# Patient Record
Sex: Female | Born: 1980 | Race: Black or African American | Hispanic: No | Marital: Single | Smoking: Former smoker
Health system: Southern US, Community
[De-identification: ages and names within clinical notes are randomized; demographics above are authoritative.]

## PROBLEM LIST (undated history)

## (undated) DIAGNOSIS — D250 Submucous leiomyoma of uterus: Secondary | ICD-10-CM

## (undated) HISTORY — PX: TONSILLECTOMY: SUR1361

---

## 2015-05-02 ENCOUNTER — Other Ambulatory Visit: Payer: Self-pay | Admitting: Obstetrics and Gynecology

## 2015-05-04 ENCOUNTER — Encounter (HOSPITAL_COMMUNITY): Payer: Self-pay | Admitting: *Deleted

## 2015-05-09 NOTE — H&P (Signed)
  Admission History and Physical Exam for a Gynecology Patient  [name] is a 35 y.o. female, No obstetric history on file., who presents for hysteroscopy, D&C, and possible resection of endometrial fibroid. She has been followed At the [facility] and Gynecology division of [facility]. She complains of menorrhagia. An ultrasound showed a multi-fibroid uterus.  OB History    No data available      History reviewed. No pertinent past medical history.  No prescriptions prior to admission    Past Surgical History  Procedure Laterality Date  . Cesarean section      x1    No Known Allergies  Family History: family history is not on file.  Social History:  reports that she has been smoking Cigarettes.  She has been smoking about 0.50 packs per day. She has never used smokeless tobacco. She reports that she does not drink alcohol or use illicit drugs.  Review of systems: See HPI.  Admission Physical Exam:    BMI equals 21.6  HEENT:                 Within normal limits Chest:                   Clear Heart:                    Regular rate and rhythm Breasts:                No masses, skin changes, bleeding, or discharge present Abdomen:             Nontender, no masses Extremities:          Grossly normal Neurologic exam: Grossly normal  Pelvic exam:  External genitalia: normal general appearance Vaginal: normal without tenderness, induration or masses Cervix: normal appearance Adnexa: normal bimanual exam Uterus: 10-12 week size, irregular, firm    Assessment:  Menorrhagia Fibroid uterus Cigarette smoker  Plan:  The patient will undergo a hysteroscopy, possible resection of submucosal fibroids, and a D&C. The patient understands the indications for her surgical procedure. She accepts the risk of, but not limited to, anesthetic complications, bleeding, infections, and possible damage to surrounding  organs.   Eli Hose 05/09/2015

## 2015-05-10 ENCOUNTER — Encounter (HOSPITAL_COMMUNITY)
Admission: RE | Disposition: A | Discharge status: Home / Self Care | Payer: Self-pay | Source: Ambulatory Visit | Attending: Obstetrics and Gynecology

## 2015-05-10 ENCOUNTER — Encounter (HOSPITAL_COMMUNITY): Payer: Self-pay | Admitting: *Deleted

## 2015-05-10 ENCOUNTER — Ambulatory Visit (HOSPITAL_COMMUNITY)
Admission: RE | Admit: 2015-05-10 | Discharge: 2015-05-10 | Disposition: A | Discharge status: Home / Self Care | Payer: Managed Care, Other (non HMO) | Source: Ambulatory Visit | Attending: Obstetrics and Gynecology | Admitting: Obstetrics and Gynecology

## 2015-05-10 ENCOUNTER — Ambulatory Visit (HOSPITAL_COMMUNITY): Payer: Managed Care, Other (non HMO) | Admitting: Anesthesiology

## 2015-05-10 DIAGNOSIS — N920 Excessive and frequent menstruation with regular cycle: Secondary | ICD-10-CM | POA: Diagnosis present

## 2015-05-10 DIAGNOSIS — D250 Submucous leiomyoma of uterus: Secondary | ICD-10-CM | POA: Diagnosis not present

## 2015-05-10 DIAGNOSIS — D649 Anemia, unspecified: Secondary | ICD-10-CM | POA: Insufficient documentation

## 2015-05-10 DIAGNOSIS — F17210 Nicotine dependence, cigarettes, uncomplicated: Secondary | ICD-10-CM | POA: Diagnosis not present

## 2015-05-10 HISTORY — PX: DILATATION & CURRETTAGE/HYSTEROSCOPY WITH RESECTOCOPE: SHX5572

## 2015-05-10 LAB — CBC
HCT: 32.5 % — ABNORMAL LOW (ref 36.0–46.0)
HEMOGLOBIN: 9.6 g/dL — AB (ref 12.0–15.0)
MCH: 19.9 pg — ABNORMAL LOW (ref 26.0–34.0)
MCHC: 29.5 g/dL — ABNORMAL LOW (ref 30.0–36.0)
MCV: 67.4 fL — ABNORMAL LOW (ref 78.0–100.0)
Platelets: 423 10*3/uL — ABNORMAL HIGH (ref 150–400)
RBC: 4.82 MIL/uL (ref 3.87–5.11)
RDW: 16.9 % — ABNORMAL HIGH (ref 11.5–15.5)
WBC: 6.2 10*3/uL (ref 4.0–10.5)

## 2015-05-10 LAB — PREGNANCY, URINE: PREG TEST UR: NEGATIVE

## 2015-05-10 SURGERY — DILATATION & CURETTAGE/HYSTEROSCOPY WITH RESECTOCOPE
Anesthesia: General | Site: Vagina

## 2015-05-10 MED ORDER — FENTANYL CITRATE (PF) 100 MCG/2ML IJ SOLN
INTRAMUSCULAR | Status: AC
  Filled 2015-05-10: qty 2

## 2015-05-10 MED ORDER — KETOROLAC TROMETHAMINE 30 MG/ML IJ SOLN
INTRAMUSCULAR | Status: DC | PRN
  Administered 2015-05-10: 30 mg via INTRAMUSCULAR
  Administered 2015-05-10: 30 mg via INTRAVENOUS

## 2015-05-10 MED ORDER — IBUPROFEN 800 MG PO TABS: 800.0000 mg | ORAL_TABLET | Freq: Three times a day (TID) | ORAL | Status: DC | PRN

## 2015-05-10 MED ORDER — MEPERIDINE HCL 25 MG/ML IJ SOLN: 6.2500 mg | INTRAMUSCULAR | Status: DC | PRN

## 2015-05-10 MED ORDER — ONDANSETRON HCL 4 MG/2ML IJ SOLN: 4.0000 mg | Freq: Once | INTRAMUSCULAR | Status: DC | PRN

## 2015-05-10 MED ORDER — DEXAMETHASONE SODIUM PHOSPHATE 4 MG/ML IJ SOLN
INTRAMUSCULAR | Status: AC
  Filled 2015-05-10: qty 1

## 2015-05-10 MED ORDER — KETOROLAC TROMETHAMINE 30 MG/ML IJ SOLN: 30.0000 mg | Freq: Once | INTRAMUSCULAR | Status: DC

## 2015-05-10 MED ORDER — GLYCINE 1.5 % IR SOLN
Status: DC | PRN
  Administered 2015-05-10 (×2): 3000 mL

## 2015-05-10 MED ORDER — BUPIVACAINE-EPINEPHRINE (PF) 0.5% -1:200000 IJ SOLN
INTRAMUSCULAR | Status: AC
  Filled 2015-05-10: qty 30

## 2015-05-10 MED ORDER — MIDAZOLAM HCL 2 MG/2ML IJ SOLN
INTRAMUSCULAR | Status: DC | PRN
  Administered 2015-05-10: 2 mg via INTRAVENOUS

## 2015-05-10 MED ORDER — FENTANYL CITRATE (PF) 100 MCG/2ML IJ SOLN: 25.0000 ug | INTRAMUSCULAR | Status: DC | PRN

## 2015-05-10 MED ORDER — SCOPOLAMINE 1 MG/3DAYS TD PT72: 1.0000 | MEDICATED_PATCH | Freq: Once | TRANSDERMAL | Status: DC

## 2015-05-10 MED ORDER — MEPERIDINE HCL 50 MG PO TABS: 50.0000 mg | ORAL_TABLET | ORAL | Status: DC | PRN

## 2015-05-10 MED ORDER — DEXAMETHASONE SODIUM PHOSPHATE 4 MG/ML IJ SOLN
INTRAMUSCULAR | Status: DC | PRN
  Administered 2015-05-10: 4 mg via INTRAVENOUS

## 2015-05-10 MED ORDER — ONDANSETRON HCL 4 MG/2ML IJ SOLN
INTRAMUSCULAR | Status: DC | PRN
  Administered 2015-05-10: 4 mg via INTRAVENOUS

## 2015-05-10 MED ORDER — KETOROLAC TROMETHAMINE 30 MG/ML IJ SOLN
INTRAMUSCULAR | Status: AC
  Filled 2015-05-10: qty 1

## 2015-05-10 MED ORDER — LACTATED RINGERS IV SOLN
INTRAVENOUS | Status: DC
  Administered 2015-05-10 (×2): via INTRAVENOUS

## 2015-05-10 MED ORDER — ONDANSETRON HCL 4 MG/2ML IJ SOLN
INTRAMUSCULAR | Status: AC
  Filled 2015-05-10: qty 2

## 2015-05-10 MED ORDER — PROPOFOL 10 MG/ML IV BOLUS
INTRAVENOUS | Status: AC
  Filled 2015-05-10: qty 20

## 2015-05-10 MED ORDER — BUPIVACAINE-EPINEPHRINE 0.5% -1:200000 IJ SOLN
INTRAMUSCULAR | Status: DC | PRN
  Administered 2015-05-10: 10 mL

## 2015-05-10 MED ORDER — PROMETHAZINE HCL 12.5 MG PO TABS: 12.5000 mg | ORAL_TABLET | Freq: Four times a day (QID) | ORAL | Status: DC | PRN

## 2015-05-10 MED ORDER — FENTANYL CITRATE (PF) 100 MCG/2ML IJ SOLN
INTRAMUSCULAR | Status: DC | PRN
  Administered 2015-05-10 (×2): 25 ug via INTRAVENOUS
  Administered 2015-05-10: 100 ug via INTRAVENOUS

## 2015-05-10 MED ORDER — SILVER NITRATE-POT NITRATE 75-25 % EX MISC
CUTANEOUS | Status: DC | PRN
  Administered 2015-05-10: 1

## 2015-05-10 MED ORDER — HYDROCODONE-ACETAMINOPHEN 7.5-325 MG PO TABS: 1.0000 | ORAL_TABLET | Freq: Once | ORAL | Status: DC | PRN

## 2015-05-10 MED ORDER — ESTRADIOL 1 MG PO TABS: 1.0000 mg | ORAL_TABLET | Freq: Every day | ORAL | Status: DC

## 2015-05-10 MED ORDER — PROPOFOL 10 MG/ML IV BOLUS
INTRAVENOUS | Status: DC | PRN
  Administered 2015-05-10: 200 mg via INTRAVENOUS

## 2015-05-10 MED ORDER — LIDOCAINE HCL (CARDIAC) 20 MG/ML IV SOLN
INTRAVENOUS | Status: DC | PRN
  Administered 2015-05-10: 80 mg via INTRAVENOUS

## 2015-05-10 MED ORDER — MIDAZOLAM HCL 2 MG/2ML IJ SOLN
INTRAMUSCULAR | Status: AC
  Filled 2015-05-10: qty 2

## 2015-05-10 SURGICAL SUPPLY — 19 items
CANISTER SUCT 3000ML (MISCELLANEOUS) ×3 IMPLANT
CATH ROBINSON RED A/P 16FR (CATHETERS) ×3 IMPLANT
CLOTH BEACON ORANGE TIMEOUT ST (SAFETY) ×3 IMPLANT
CONTAINER PREFILL 10% NBF 60ML (FORM) ×6 IMPLANT
ELECT REM PT RETURN 9FT ADLT (ELECTROSURGICAL)
ELECTRODE REM PT RTRN 9FT ADLT (ELECTROSURGICAL) IMPLANT
GLOVE BIOGEL PI IND STRL 7.0 (GLOVE) ×1 IMPLANT
GLOVE BIOGEL PI IND STRL 8.5 (GLOVE) ×1 IMPLANT
GLOVE BIOGEL PI INDICATOR 7.0 (GLOVE) ×2
GLOVE BIOGEL PI INDICATOR 8.5 (GLOVE) ×2
GLOVE ECLIPSE 8.0 STRL XLNG CF (GLOVE) ×6 IMPLANT
GOWN STRL REUS W/TWL LRG LVL3 (GOWN DISPOSABLE) ×6 IMPLANT
LOOP ANGLED CUTTING 22FR (CUTTING LOOP) IMPLANT
PACK VAGINAL MINOR WOMEN LF (CUSTOM PROCEDURE TRAY) ×3 IMPLANT
PAD OB MATERNITY 4.3X12.25 (PERSONAL CARE ITEMS) ×3 IMPLANT
TOWEL OR 17X24 6PK STRL BLUE (TOWEL DISPOSABLE) ×6 IMPLANT
TUBING AQUILEX INFLOW (TUBING) ×3 IMPLANT
TUBING AQUILEX OUTFLOW (TUBING) ×3 IMPLANT
WATER STERILE IRR 1000ML POUR (IV SOLUTION) ×3 IMPLANT

## 2015-05-10 NOTE — Anesthesia Procedure Notes (Signed)
Procedure Name: LMA Insertion Date/Time: 05/10/2015 9:56 AM Performed by: Bufford Spikes Pre-anesthesia Checklist: Patient identified, Emergency Drugs available, Suction available and Patient being monitored Patient Re-evaluated:Patient Re-evaluated prior to inductionOxygen Delivery Method: Circle system utilized Preoxygenation: Pre-oxygenation with 100% oxygen Intubation Type: IV induction LMA: LMA inserted LMA Size: 3.0 Dental Injury: Teeth and Oropharynx as per pre-operative assessment

## 2015-05-10 NOTE — Anesthesia Preprocedure Evaluation (Signed)
Anesthesia Evaluation  Patient identified by MRN, date of birth, ID band Patient awake    Reviewed: Allergy & Precautions, H&P , NPO status , Patient's Chart, lab work & pertinent test results  Airway Mallampati: I  TM Distance: >3 FB Neck ROM: full    Dental no notable dental hx. (+) Teeth Intact   Pulmonary Current Smoker,    Pulmonary exam normal        Cardiovascular negative cardio ROS Normal cardiovascular exam     Neuro/Psych negative neurological ROS  negative psych ROS   GI/Hepatic negative GI ROS, Neg liver ROS,   Endo/Other  negative endocrine ROS  Renal/GU negative Renal ROS     Musculoskeletal   Abdominal Normal abdominal exam  (+)   Peds  Hematology negative hematology ROS (+)   Anesthesia Other Findings   Reproductive/Obstetrics negative OB ROS                             Anesthesia Physical Anesthesia Plan  ASA: II  Anesthesia Plan: General   Post-op Pain Management:    Induction: Intravenous  Airway Management Planned: LMA  Additional Equipment:   Intra-op Plan:   Post-operative Plan:   Informed Consent: I have reviewed the patients History and Physical, chart, labs and discussed the procedure including the risks, benefits and alternatives for the proposed anesthesia with the patient or authorized representative who has indicated his/her understanding and acceptance.     Plan Discussed with: CRNA and Surgeon  Anesthesia Plan Comments:         Anesthesia Quick Evaluation

## 2015-05-10 NOTE — Transfer of Care (Signed)
Immediate Anesthesia Transfer of Care Note  Patient: Kelly Bender  Procedure(s) Performed: Procedure(s): DILATATION & CURETTAGE/Hystroscopic Resection Submucosal Fibroids (N/A)  Patient Location: PACU  Anesthesia Type:General  Level of Consciousness: awake, alert  and oriented  Airway & Oxygen Therapy: Patient Spontanous Breathing and Patient connected to nasal cannula oxygen  Post-op Assessment: Report given to RN and Post -op Vital signs reviewed and stable  Post vital signs: Reviewed and stable  Last Vitals:  Filed Vitals:   05/10/15 0848  BP: 116/78  Pulse: 80  Temp: 36.7 C  Resp: 20    Complications: No apparent anesthesia complications

## 2015-05-10 NOTE — Progress Notes (Signed)
The patient was interviewed and examined today.  The previously documented history and physical examination was reviewed. There are no changes. The operative procedure was reviewed. The risks and benefits were outlined again. The specific risks include, but are not limited to, anesthetic complications, bleeding, infections, and possible damage to the surrounding organs. The patient's questions were answered.  We are ready to proceed as outlined. The likelihood of the patient achieving the goals of this procedure is very likely.   BP 116/78 mmHg  Pulse 80  Temp(Src) 98 F (36.7 C) (Oral)  Resp 20  Ht 5\' 2"  (1.575 m)  Wt 115 lb (52.164 kg)  BMI 21.03 kg/m2  SpO2 100%  CBC    Component Value Date/Time   WBC 6.2 05/10/2015 0845   RBC 4.82 05/10/2015 0845   HGB 9.6* 05/10/2015 0845   HCT 32.5* 05/10/2015 0845   PLT 423* 05/10/2015 0845   MCV 67.4* 05/10/2015 0845   MCH 19.9* 05/10/2015 0845   MCHC 29.5* 05/10/2015 0845   RDW 16.9* 05/10/2015 0845    Gildardo Cranker, M.D.

## 2015-05-10 NOTE — Discharge Instructions (Signed)
Hysteroscopy, Care After °Refer to this sheet in the next few weeks. These instructions provide you with information on caring for yourself after your procedure. Your health care provider may also give you more specific instructions. Your treatment has been planned according to current medical practices, but problems sometimes occur. Call your health care provider if you have any problems or questions after your procedure.  °WHAT TO EXPECT AFTER THE PROCEDURE °After your procedure, it is typical to have the following: °· You may have some cramping. This normally lasts for a couple days. °· You may have bleeding. This can vary from light spotting for a few days to menstrual-like bleeding for 3-7 days. °HOME CARE INSTRUCTIONS °· Rest for the first 1-2 days after the procedure. °· Only take over-the-counter or prescription medicines as directed by your health care provider. Do not take aspirin. It can increase the chances of bleeding. °· Take showers instead of baths for 2 weeks or as directed by your health care provider. °· Do not drive for 24 hours or as directed. °· Do not drink alcohol while taking pain medicine. °· Do not use tampons, douche, or have sexual intercourse for 2 weeks or until your health care provider says it is okay. °· Take your temperature twice a day for 4-5 days. Write it down each time. °· Follow your health care provider's advice about diet, exercise, and lifting. °· If you develop constipation, you may: °¨ Take a mild laxative if your health care provider approves. °¨ Add bran foods to your diet. °¨ Drink enough fluids to keep your urine clear or pale yellow. °· Try to have someone with you or available to you for the first 24-48 hours, especially if you were given a general anesthetic. °· Follow up with your health care provider as directed. °SEEK MEDICAL CARE IF: °· You feel dizzy or lightheaded. °· You feel sick to your stomach (nauseous). °· You have abnormal vaginal discharge. °· You  have a rash. °· You have pain that is not controlled with medicine. °SEEK IMMEDIATE MEDICAL CARE IF: °· You have bleeding that is heavier than a normal menstrual period. °· You have a fever. °· You have increasing cramps or pain, not controlled with medicine. °· You have new belly (abdominal) pain. °· You pass out. °· You have pain in the tops of your shoulders (shoulder strap areas). °· You have shortness of breath. °  °This information is not intended to replace advice given to you by your health care provider. Make sure you discuss any questions you have with your health care provider. °  °Document Released: 01/20/2013 Document Reviewed: 01/20/2013 °Elsevier Interactive Patient Education ©2016 [employer] °DISCHARGE INSTRUCTIONS: D&C / D&E °The following instructions have been prepared to help you care for yourself upon your return home. °  °Personal hygiene: °• Use sanitary pads for vaginal drainage, not tampons. °• Shower the day after your procedure. °• NO tub baths, pools or Jacuzzis for 2-3 weeks. °• Wipe front to back after using the bathroom. ° °Activity and limitations: °• Do NOT drive or operate any equipment for 24 hours. The effects of anesthesia are still present and drowsiness may result. °• Do NOT rest in bed all day. °• Walking is encouraged. °• Walk up and down stairs slowly. °• You may resume your normal activity in one to two days or as indicated by your physician. ° °Sexual activity: NO intercourse for at least 2 weeks after the procedure, or as indicated by   your physician. ° °Diet: Eat a light meal as desired this evening. You may resume your usual diet tomorrow. ° °Return to work: You may resume your work activities in one to two days or as indicated by your doctor. ° °What to expect after your surgery: Expect to have vaginal bleeding/discharge for 2-3 days and spotting for up to 10 days. It is not unusual to have soreness for up to 1-2 weeks. You may have a slight burning sensation when  you urinate for the first day. Mild cramps may continue for a couple of days. You may have a regular period in 2-6 weeks. ° °Call your doctor for any of the following: °• Excessive vaginal bleeding, saturating and changing one pad every hour. °• Inability to urinate 6 hours after discharge from hospital. °• Pain not relieved by pain medication. °• Fever of 100.4° F or greater. °• Unusual vaginal discharge or odor. ° ° Call for an appointment:  ° ° °Patient’s signature: ______________________ ° °Nurse’s signature ________________________ ° °Support person's signature_______________________ ° ° ° °

## 2015-05-10 NOTE — Op Note (Signed)
OPERATIVE NOTE  Kelly Bender  DOB:    1980/08/11  MRN:    [id number]  CSN:    [id number]  Date of Surgery:  05/10/2015  Preoperative Diagnosis:  Fibroid uterus  Menorrhagia  Anemia  Postoperative Diagnosis:  Fibroid uterus  Submucosal fibroids  Menorrhagia  Anemia  Procedure:  Hysteroscopy with resection of submucosal fibroids Dilatation and curettage  Surgeon:  Gildardo Cranker, M.D.  Assistant:  None  Anesthetic:  General  Disposition:  The patient is a 35 y.o.-year-old female who presents with fibroids, menorrhagia, and anemia (hemoglobin 9.8). She understands the indications for her surgical procedure. She accepts the risk of, but not limited to, anesthetic complications, bleeding, infections, and possible damage to the surrounding organs.  Findings:  On examination under anesthesia the uterus was 12 weeks size. No adnexal masses were appreciated. No parametrial disease was appreciated. The uterus sounded to 10 cm. The patient was noted to have 3 submucosal fibroids with the largest submucosal fibroid measuring 3 cm in diameter.  Procedure:  The patient was taken to the operating room where a general anesthetic was given. The perineum and vagina were prepped with Betadine. The bladder was drained of urine. The patient was sterilely draped. Examination under anesthesia was performed. A paracervical block was placed using 10 cc of half percent Marcaine with epinephrine. An endocervical curettage was performed. The cervix was gently dilated. The diagnostic hysteroscope was inserted and the cavity was carefully inspected. Pictures were taken. Findings included: 3 submucosal fibroids. The diagnostic hysteroscope was removed. The cervix was dilated further. The operative hysteroscope was inserted. The submucosal fibroids were resected using a single loop. The largest fibroid was located on the right posterior portion of the uterine cavity. The cavity was  then curetted using a sharp curet. The cavity was felt to be clean at the end of our procedure. Hemostasis was adequate. All instruments were removed. The examination was repeated and the uterus was noted to be firm. Sponge, and needle counts were correct. The estimated blood loss for the procedure was 50 cc. The estimated fluid deficit loss 600 cc. The patient was awakened from her anesthetic without difficulty. She was returned to the supine position and and transported to the recovery room in stable condition. The endocervical curettings, endometrial resections, and endometrial curettings were sent to pathology.  Followup instructions:  The patient will return to see Dr. Raphael Gibney in 2 weeks. She was given a copy of the postoperative instructions for patients who've undergone hysteroscopy.  Discharge medications:  Motrin 800 mg every 8 hours as needed for mild to moderate pain. Demerol 50 mg one tablet every 4 hours as needed for severe pain. Phenergan 12.5 mg every 6 hours as needed for nausea. Estradiol 1 mg 1 tablet daily  Gildardo Cranker, M.D.  05/10/2015

## 2015-05-11 ENCOUNTER — Encounter (HOSPITAL_COMMUNITY): Payer: Self-pay | Admitting: Obstetrics and Gynecology

## 2015-05-11 NOTE — Anesthesia Postprocedure Evaluation (Signed)
Anesthesia Post Note  Patient: Kelly Bender  Procedure(s) Performed: Procedure(s) (LRB): DILATATION & CURETTAGE/Hystroscopic Resection Submucosal Fibroids (N/A)  Patient location during evaluation: PACU Anesthesia Type: General Level of consciousness: awake Pain management: pain level controlled Vital Signs Assessment: post-procedure vital signs reviewed and stable Respiratory status: spontaneous breathing Cardiovascular status: stable Postop Assessment: no signs of nausea or vomiting Anesthetic complications: no    Last Vitals:  Filed Vitals:   05/10/15 1215 05/10/15 1221  BP: 110/69   Pulse: 83 81  Temp:  37.2 C  Resp: 15 17    Last Pain:  Filed Vitals:   05/10/15 1221  PainSc: 2                  Tullio Chausse JR,JOHN Gabriela Irigoyen

## 2016-08-20 ENCOUNTER — Other Ambulatory Visit (HOSPITAL_COMMUNITY): Payer: Self-pay | Admitting: Obstetrics and Gynecology

## 2016-08-20 DIAGNOSIS — N979 Female infertility, unspecified: Secondary | ICD-10-CM

## 2016-08-22 ENCOUNTER — Ambulatory Visit (HOSPITAL_COMMUNITY)
Admission: RE | Admit: 2016-08-22 | Discharge: 2016-08-22 | Disposition: A | Discharge status: Home / Self Care | Payer: PRIVATE HEALTH INSURANCE | Source: Ambulatory Visit | Attending: Obstetrics and Gynecology | Admitting: Obstetrics and Gynecology

## 2016-08-22 ENCOUNTER — Encounter (HOSPITAL_COMMUNITY): Payer: Self-pay | Admitting: Radiology

## 2016-08-22 DIAGNOSIS — R938 Abnormal findings on diagnostic imaging of other specified body structures: Secondary | ICD-10-CM | POA: Diagnosis not present

## 2016-08-22 DIAGNOSIS — N979 Female infertility, unspecified: Secondary | ICD-10-CM | POA: Diagnosis not present

## 2016-08-22 MED ORDER — IOPAMIDOL (ISOVUE-300) INJECTION 61%
30.0000 mL | Freq: Once | INTRAVENOUS | Status: AC | PRN
  Administered 2016-08-22: 30 mL

## 2017-08-15 ENCOUNTER — Other Ambulatory Visit: Payer: Self-pay | Admitting: Obstetrics and Gynecology

## 2017-08-26 ENCOUNTER — Encounter (HOSPITAL_BASED_OUTPATIENT_CLINIC_OR_DEPARTMENT_OTHER): Payer: Self-pay | Admitting: *Deleted

## 2017-08-26 ENCOUNTER — Other Ambulatory Visit: Payer: Self-pay

## 2017-08-26 NOTE — Progress Notes (Signed)
SPOKE W/ PT VIA PHONE FOR PRE-OP INTERVIEW.  NPO AFTER MN W/ EXCEPTION CLEAR LIQUIDS UNTIL 0800 (NO CREAM/ MILK PRODUCTS).  ARRIVE AT 1200.  NEEDS URINE PREG.

## 2017-08-28 NOTE — H&P (Signed)
Admission History and Physical Exam for a Gynecology Patient  [name] is a 37 y.o. female, No obstetric history on file., who presents for hysteroscopy, hysteroscopic resection of a submucosal fibroid, and dilation and curettage. She has been followed at the [facility] and Gynecology division of [facility].  She has a history of fibroids and she has a known submucosal myoma measuring approximately 2.7 cm.  OB History   None     Past Medical History:  Diagnosis Date  . Submucous uterine fibroid     Medications:  Prenatal vitamins Biotin  Past Surgical History:  Procedure Laterality Date  . CESAREAN SECTION  12/27/2008  . DILATATION & CURRETTAGE/HYSTEROSCOPY WITH RESECTOCOPE N/A 05/10/2015   Procedure: DILATATION & CURETTAGE/Hystroscopic Resection Submucosal Fibroids;  Surgeon: Ena Dawley, MD;  Location: [facility];  Service: Gynecology;  Laterality: N/A;  . TONSILLECTOMY  07-26-2016  dr Ilda Foil  [facility]    No Known Allergies  Family History: family history is not on file.  Social History:  reports that she quit smoking about 3 weeks ago. Her smoking use included cigarettes. She has a 3.50 pack-year smoking history. She has never used smokeless tobacco. She reports that she does not drink alcohol or use drugs.  Review of systems: See HPI.  Admission Physical Exam:    Body mass index is 23.78 kg/m.  Height 5\' 2"  (1.575 m), weight 59 kg (130 lb), last menstrual period 08/20/2017.  HEENT:                 Within normal limits Chest:                   Clear Heart:                    Regular rate and rhythm Breasts:                No masses, skin changes, bleeding, or discharge present Abdomen:             Nontender, no masses Extremities:          Grossly normal Neurologic exam: Grossly normal  Pelvic exam:  Vulva and vagina appear normal. Bimanual exam reveals normal uterus and adnexa. External genitalia: normal general  appearance Vaginal: normal mucosa without prolapse or lesions Cervix: normal appearance Adnexa: normal bimanual exam Uterus: Normal size shape and consistency  Assessment:  Submucosal fibroid  Plan:  The patient will have a hysteroscopy, hysteroscopic resection of the submucosal fibroid, and dilation and curettage.  She understands the indications for her surgical procedure.  She accepts the risk of, but not limited to, anesthetic complications, bleeding, infections, and possible damage to the surrounding organs.   Eli Hose 08/28/2017

## 2017-08-29 ENCOUNTER — Encounter (HOSPITAL_BASED_OUTPATIENT_CLINIC_OR_DEPARTMENT_OTHER)
Admission: RE | Disposition: A | Discharge status: Home / Self Care | Payer: Self-pay | Source: Ambulatory Visit | Attending: Obstetrics and Gynecology

## 2017-08-29 ENCOUNTER — Ambulatory Visit (HOSPITAL_BASED_OUTPATIENT_CLINIC_OR_DEPARTMENT_OTHER): Payer: BLUE CROSS/BLUE SHIELD | Admitting: Anesthesiology

## 2017-08-29 ENCOUNTER — Ambulatory Visit (HOSPITAL_BASED_OUTPATIENT_CLINIC_OR_DEPARTMENT_OTHER)
Admission: RE | Admit: 2017-08-29 | Discharge: 2017-08-29 | Disposition: A | Discharge status: Home / Self Care | Payer: BLUE CROSS/BLUE SHIELD | Source: Ambulatory Visit | Attending: Obstetrics and Gynecology | Admitting: Obstetrics and Gynecology

## 2017-08-29 ENCOUNTER — Encounter (HOSPITAL_BASED_OUTPATIENT_CLINIC_OR_DEPARTMENT_OTHER): Payer: Self-pay

## 2017-08-29 DIAGNOSIS — Z87891 Personal history of nicotine dependence: Secondary | ICD-10-CM | POA: Diagnosis not present

## 2017-08-29 DIAGNOSIS — D250 Submucous leiomyoma of uterus: Secondary | ICD-10-CM | POA: Insufficient documentation

## 2017-08-29 DIAGNOSIS — D649 Anemia, unspecified: Secondary | ICD-10-CM | POA: Diagnosis not present

## 2017-08-29 DIAGNOSIS — N920 Excessive and frequent menstruation with regular cycle: Secondary | ICD-10-CM | POA: Insufficient documentation

## 2017-08-29 HISTORY — DX: Submucous leiomyoma of uterus: D25.0

## 2017-08-29 HISTORY — PX: DILATATION & CURRETTAGE/HYSTEROSCOPY WITH RESECTOCOPE: SHX5572

## 2017-08-29 LAB — POCT PREGNANCY, URINE: PREG TEST UR: NEGATIVE

## 2017-08-29 SURGERY — DILATATION & CURETTAGE/HYSTEROSCOPY WITH RESECTOCOPE
Anesthesia: General | Site: Uterus

## 2017-08-29 MED ORDER — PROPOFOL 10 MG/ML IV BOLUS
INTRAVENOUS | Status: DC | PRN
  Administered 2017-08-29: 120 mg via INTRAVENOUS

## 2017-08-29 MED ORDER — FENTANYL CITRATE (PF) 100 MCG/2ML IJ SOLN
INTRAMUSCULAR | Status: AC
  Filled 2017-08-29: qty 2

## 2017-08-29 MED ORDER — LIDOCAINE 2% (20 MG/ML) 5 ML SYRINGE
INTRAMUSCULAR | Status: AC
  Filled 2017-08-29: qty 5

## 2017-08-29 MED ORDER — LIDOCAINE HCL (CARDIAC) PF 100 MG/5ML IV SOSY
PREFILLED_SYRINGE | INTRAVENOUS | Status: DC | PRN
  Administered 2017-08-29: 100 mg via INTRAVENOUS

## 2017-08-29 MED ORDER — MIDAZOLAM HCL 2 MG/2ML IJ SOLN
INTRAMUSCULAR | Status: AC
  Filled 2017-08-29: qty 2

## 2017-08-29 MED ORDER — OXYCODONE-ACETAMINOPHEN 5-325 MG/5ML PO SOLN: 5.0000 mL | ORAL | 0 refills | Status: DC | PRN

## 2017-08-29 MED ORDER — PROPOFOL 10 MG/ML IV BOLUS
INTRAVENOUS | Status: AC
Start: 2017-08-29 — End: ?
  Filled 2017-08-29: qty 20

## 2017-08-29 MED ORDER — IBUPROFEN 800 MG PO TABS: 800.0000 mg | ORAL_TABLET | Freq: Three times a day (TID) | ORAL | 1 refills | Status: DC | PRN

## 2017-08-29 MED ORDER — PROMETHAZINE HCL 25 MG/ML IJ SOLN
6.2500 mg | INTRAMUSCULAR | Status: DC | PRN
  Filled 2017-08-29: qty 1

## 2017-08-29 MED ORDER — BUPIVACAINE-EPINEPHRINE 0.5% -1:200000 IJ SOLN
INTRAMUSCULAR | Status: DC | PRN
  Administered 2017-08-29: 10 mL

## 2017-08-29 MED ORDER — KETOROLAC TROMETHAMINE 30 MG/ML IJ SOLN
INTRAMUSCULAR | Status: AC
  Filled 2017-08-29: qty 1

## 2017-08-29 MED ORDER — KETOROLAC TROMETHAMINE 30 MG/ML IJ SOLN
INTRAMUSCULAR | Status: DC | PRN
  Administered 2017-08-29: 30 mg via INTRAVENOUS

## 2017-08-29 MED ORDER — FENTANYL CITRATE (PF) 100 MCG/2ML IJ SOLN
25.0000 ug | INTRAMUSCULAR | Status: DC | PRN
  Filled 2017-08-29: qty 1

## 2017-08-29 MED ORDER — SILVER NITRATE-POT NITRATE 75-25 % EX MISC
CUTANEOUS | Status: DC | PRN
  Administered 2017-08-29: 1

## 2017-08-29 MED ORDER — FENTANYL CITRATE (PF) 100 MCG/2ML IJ SOLN
INTRAMUSCULAR | Status: DC | PRN
  Administered 2017-08-29 (×5): 25 ug via INTRAVENOUS
  Administered 2017-08-29: 50 ug via INTRAVENOUS
  Administered 2017-08-29: 25 ug via INTRAVENOUS

## 2017-08-29 MED ORDER — LACTATED RINGERS IV SOLN
INTRAVENOUS | Status: DC
  Administered 2017-08-29: 13:00:00 via INTRAVENOUS
  Filled 2017-08-29: qty 1000

## 2017-08-29 MED ORDER — MIDAZOLAM HCL 5 MG/5ML IJ SOLN
INTRAMUSCULAR | Status: DC | PRN
  Administered 2017-08-29: 2 mg via INTRAVENOUS

## 2017-08-29 MED ORDER — ONDANSETRON HCL 4 MG/2ML IJ SOLN
INTRAMUSCULAR | Status: DC | PRN
  Administered 2017-08-29: 4 mg via INTRAVENOUS

## 2017-08-29 MED ORDER — DEXAMETHASONE SODIUM PHOSPHATE 4 MG/ML IJ SOLN
INTRAMUSCULAR | Status: DC | PRN
  Administered 2017-08-29: 10 mg via INTRAVENOUS

## 2017-08-29 MED ORDER — SODIUM CHLORIDE 0.9 % IR SOLN
Status: DC | PRN
  Administered 2017-08-29 (×4): 3000 mL

## 2017-08-29 MED ORDER — KETOROLAC TROMETHAMINE 60 MG/2ML IM SOLN
INTRAMUSCULAR | Status: DC | PRN
  Administered 2017-08-29: 30 mg via INTRAMUSCULAR

## 2017-08-29 SURGICAL SUPPLY — 25 items
BIPOLAR CUTTING LOOP 21FR (ELECTRODE) ×2
CANISTER SUCT 3000ML PPV (MISCELLANEOUS) ×12 IMPLANT
CATH ROBINSON RED A/P 16FR (CATHETERS) ×3 IMPLANT
ELECT REM PT RETURN 9FT ADLT (ELECTROSURGICAL) ×3
ELECTRODE REM PT RTRN 9FT ADLT (ELECTROSURGICAL) ×1 IMPLANT
GLOVE BIO SURGEON STRL SZ 6.5 (GLOVE) ×2 IMPLANT
GLOVE BIO SURGEONS STRL SZ 6.5 (GLOVE) ×1
GLOVE BIOGEL PI IND STRL 6.5 (GLOVE) ×1 IMPLANT
GLOVE BIOGEL PI IND STRL 7.5 (GLOVE) ×2 IMPLANT
GLOVE BIOGEL PI IND STRL 8.5 (GLOVE) ×1 IMPLANT
GLOVE BIOGEL PI INDICATOR 6.5 (GLOVE) ×2
GLOVE BIOGEL PI INDICATOR 7.5 (GLOVE) ×4
GLOVE BIOGEL PI INDICATOR 8.5 (GLOVE) ×2
GLOVE ECLIPSE 8.0 STRL XLNG CF (GLOVE) ×6 IMPLANT
GOWN STRL REUS W/ TWL XL LVL3 (GOWN DISPOSABLE) ×1 IMPLANT
GOWN STRL REUS W/TWL LRG LVL3 (GOWN DISPOSABLE) ×6 IMPLANT
GOWN STRL REUS W/TWL XL LVL3 (GOWN DISPOSABLE) ×2
IV NS IRRIG 3000ML ARTHROMATIC (IV SOLUTION) ×12 IMPLANT
KIT TURNOVER CYSTO (KITS) ×3 IMPLANT
LOOP CUTTING BIPOLAR 21FR (ELECTRODE) ×1 IMPLANT
PACK VAGINAL MINOR WOMEN LF (CUSTOM PROCEDURE TRAY) ×3 IMPLANT
PAD OB MATERNITY 4.3X12.25 (PERSONAL CARE ITEMS) ×3 IMPLANT
TOWEL OR 17X24 6PK STRL BLUE (TOWEL DISPOSABLE) ×6 IMPLANT
TUBING AQUILEX INFLOW (TUBING) ×3 IMPLANT
TUBING AQUILEX OUTFLOW (TUBING) ×3 IMPLANT

## 2017-08-29 NOTE — Anesthesia Preprocedure Evaluation (Signed)
Anesthesia Evaluation  Patient identified by MRN, date of birth, ID band Patient awake    Reviewed: Allergy & Precautions, NPO status   Airway Mallampati: II  TM Distance: >3 FB Neck ROM: Full    Dental  (+) Dental Advisory Given   Pulmonary former smoker,    breath sounds clear to auscultation       Cardiovascular negative cardio ROS   Rhythm:Regular Rate:Normal     Neuro/Psych negative neurological ROS     GI/Hepatic negative GI ROS, Neg liver ROS,   Endo/Other  negative endocrine ROS  Renal/GU negative Renal ROS     Musculoskeletal   Abdominal   Peds  Hematology  (+) anemia ,   Anesthesia Other Findings   Reproductive/Obstetrics                             Lab Results  Component Value Date   WBC 6.2 05/10/2015   HGB 9.6 (L) 05/10/2015   HCT 32.5 (L) 05/10/2015   MCV 67.4 (L) 05/10/2015   PLT 423 (H) 05/10/2015    Anesthesia Physical Anesthesia Plan  ASA: II  Anesthesia Plan: General   Post-op Pain Management:    Induction: Intravenous  PONV Risk Score and Plan: 3 and Dexamethasone, Ondansetron, Midazolam and Treatment may vary due to age or medical condition  Airway Management Planned: LMA  Additional Equipment:   Intra-op Plan:   Post-operative Plan: Extubation in OR  Informed Consent: I have reviewed the patients History and Physical, chart, labs and discussed the procedure including the risks, benefits and alternatives for the proposed anesthesia with the patient or authorized representative who has indicated his/her understanding and acceptance.   Dental advisory given  Plan Discussed with:   Anesthesia Plan Comments:         Anesthesia Quick Evaluation

## 2017-08-29 NOTE — Anesthesia Procedure Notes (Signed)
Procedure Name: LMA Insertion Date/Time: 08/29/2017 2:28 PM Performed by: Justice Rocher, CRNA Pre-anesthesia Checklist: Patient identified, Emergency Drugs available, Suction available and Patient being monitored Patient Re-evaluated:Patient Re-evaluated prior to induction Oxygen Delivery Method: Circle system utilized Preoxygenation: Pre-oxygenation with 100% oxygen Induction Type: IV induction Ventilation: Mask ventilation without difficulty LMA: LMA inserted LMA Size: 4.0 Number of attempts: 1 Airway Equipment and Method: Bite block Placement Confirmation: positive ETCO2 and breath sounds checked- equal and bilateral Tube secured with: Tape Dental Injury: Teeth and Oropharynx as per pre-operative assessment

## 2017-08-29 NOTE — Anesthesia Postprocedure Evaluation (Signed)
Anesthesia Post Note  Patient: Kelly Bender  Procedure(s) Performed: DILATATION & CURETTAGE/HYSTEROSCOPY WITH RESECTOCOPE (N/A Uterus)     Patient location during evaluation: PACU Anesthesia Type: General Level of consciousness: awake and alert Pain management: pain level controlled Vital Signs Assessment: post-procedure vital signs reviewed and stable Respiratory status: spontaneous breathing, nonlabored ventilation, respiratory function stable and patient connected to nasal cannula oxygen Cardiovascular status: blood pressure returned to baseline and stable Postop Assessment: no apparent nausea or vomiting Anesthetic complications: no    Last Vitals:  Vitals:   08/29/17 1600 08/29/17 1615  BP: 119/70 115/80  Pulse: 86 90  Resp: 17 18  Temp:    SpO2: 99% 100%    Last Pain:  Vitals:   08/29/17 1615  TempSrc:   PainSc: 0-No pain                 Barnet Glasgow

## 2017-08-29 NOTE — Progress Notes (Signed)
The patient was interviewed and examined today.  The previously documented history and physical examination was reviewed. There are no changes. The operative procedure was reviewed. The risks and benefits were outlined again. The specific risks include, but are not limited to, anesthetic complications, bleeding, infections, and possible damage to the surrounding organs. The patient's questions were answered.  We are ready to proceed as outlined. The likelihood of the patient achieving the goals of this procedure is very likely.    CBC    Component Value Date/Time   WBC 6.2 05/10/2015 0845   RBC 4.82 05/10/2015 0845   HGB 9.6 (L) 05/10/2015 0845   HCT 32.5 (L) 05/10/2015 0845   PLT 423 (H) 05/10/2015 0845   MCV 67.4 (L) 05/10/2015 0845   MCH 19.9 (L) 05/10/2015 0845   MCHC 29.5 (L) 05/10/2015 0845   RDW 16.9 (H) 05/10/2015 0845   BP 115/78   Pulse 83   Temp 98.3 F (36.8 C) (Oral)   Resp 16   Ht 5\' 2"  (1.575 m)   Wt 58.5 kg (128 lb 14.4 oz)   LMP 08/20/2017 (Approximate)   SpO2 100%   BMI 23.58 kg/m  Results for orders placed or performed during the hospital encounter of 08/29/17 (from the past 24 hour(s))  Pregnancy, urine POC     Status: None   Collection Time: 08/29/17 12:22 PM  Result Value Ref Range   Preg Test, Ur NEGATIVE NEGATIVE    Gildardo Cranker, M.D.

## 2017-08-29 NOTE — Transfer of Care (Signed)
Immediate Anesthesia Transfer of Care Note  Patient: Kelly Bender  Procedure(s) Performed: Procedure(s) (LRB): DILATATION & CURETTAGE/HYSTEROSCOPY WITH RESECTOCOPE (N/A)  Patient Location: PACU  Anesthesia Type: General  Level of Consciousness: awake, sedated, patient cooperative and responds to stimulation  Airway & Oxygen Therapy: Patient Spontanous Breathing and Patient connected to [place] O2  Post-op Assessment: Report given to PACU RN, Post -op Vital signs reviewed and stable and Patient moving all extremities  Post vital signs: Reviewed and stable  Complications: No apparent anesthesia complications

## 2017-08-29 NOTE — Op Note (Signed)
OPERATIVE NOTE  Kelly Bender  DOB:    [birth year]  MRN:    [id number]  CSN:    [id number]  Date of Surgery:  08/29/2017  Preoperative Diagnosis:  Submucosal fibroid  Anemia (hemoglobin equals 9.6)  Heavy periods  Postoperative Diagnosis:  Submucosal fibroid  Anemia (hemoglobin equals 9.6)  Heavy.  Procedure:  Hysteroscopy Hysteroscopic resection of the submucosal fibroid  Dilatation and curettage  Surgeon:  Gildardo Cranker, M.D.  Assistant:  None  Anesthetic:  General  Disposition:  The patient is a 37 y.o.-year-old female who presents with the above diagnosis. She understands the indications for her surgical procedure. She accepts the risk of, but not limited to, anesthetic complications, bleeding, infections, and possible damage to the surrounding organs.  Findings:  On examination under anesthesia the uterus was 8-10  weeks size. No adnexal masses were appreciated. No parametrial disease was appreciated. The uterus sounded to 3.75 inches. The patient was noted to have a 2.7 cm submucosal fibroid in the right upper uterus.  The tubal ostia appeared normal.  Pictures were taken.  Procedure:  The patient was taken to the operating room where a general anesthetic was given. The perineum and vagina were prepped with Betadine. The bladder was drained of urine. The patient was sterilely draped. Examination under anesthesia was performed. A paracervical block was placed using 10 cc of half percent Marcaine with epinephrine. An endocervical curettage was performed. The cervix was gently dilated. The diagnostic hysteroscope was inserted and the cavity was carefully inspected. Pictures were taken. Findings included: A 2.7 cm submucosal fibroid in the right upper uterus. The diagnostic hysteroscope was removed. The cervix was dilated further. The operative hysteroscope was inserted. The submucosal fibroid was resected using the resectoscope. The cavity was then  curetted using a sharp curet. The cavity was felt to be clean at the end of our procedure. Hemostasis was adequate. All instruments were removed. The examination was repeated and the uterus was noted to be firm. Sponge, and needle counts were correct. The estimated blood loss for the procedure was 10 cc. The estimated fluid deficit loss 2450 cc. The patient was awakened from her anesthetic without difficulty. She was returned to the supine position and and transported to the recovery room in stable condition. The endocervical curettings, endometrial resections, and endometrial curettings were sent to pathology.  Followup instructions:  The patient will return to see Dr. Raphael Gibney in 2 weeks. She was given a copy of the postoperative instructions for patients who've undergone hysteroscopy.  Discharge medications:  Motrin 800 mg every 8 hours as needed for mild to moderate pain. Percocet one tablet every 4 hours as needed for severe pain.   Gildardo Cranker, M.D. Aug 29, 2017 3:37 PM

## 2017-08-29 NOTE — Discharge Instructions (Signed)
Hysteroscopy, Care After Refer to this sheet in the next few weeks. These instructions provide you with information on caring for yourself after your procedure. Your health care provider may also give you more specific instructions. Your treatment has been planned according to current medical practices, but problems sometimes occur. Call your health care provider if you have any problems or questions after your procedure. What can I expect after the procedure? After your procedure, it is typical to have the following:  You may have some cramping. This normally lasts for a couple days.  You may have bleeding. This can vary from light spotting for a few days to menstrual-like bleeding for 3-7 days.  Follow these instructions at home:  Rest for the first 1-2 days after the procedure.  Only take over-the-counter or prescription medicines as directed by your health care provider. Do not take aspirin. It can increase the chances of bleeding.  Take showers instead of baths for 2 weeks or as directed by your health care provider.  Do not drive for 24 hours or as directed.  Do not drink alcohol while taking pain medicine.  Do not use tampons, douche, or have sexual intercourse for 2 weeks or until your health care provider says it is okay.  Take your temperature twice a day for 4-5 days. Write it down each time.  Follow your health care provider's advice about diet, exercise, and lifting.  If you develop constipation, you may: ? Take a mild laxative if your health care provider approves. ? Add bran foods to your diet. ? Drink enough fluids to keep your urine clear or pale yellow.  Try to have someone with you or available to you for the first 24-48 hours, especially if you were given a general anesthetic.  Follow up with your health care provider as directed. Contact a health care provider if:  You feel dizzy or lightheaded.  You feel sick to your stomach (nauseous).  You have  abnormal vaginal discharge.  You have a rash.  You have pain that is not controlled with medicine. Get help right away if:  You have bleeding that is heavier than a normal menstrual period.  You have a fever.  You have increasing cramps or pain, not controlled with medicine.  You have new belly (abdominal) pain.  You pass out.  You have pain in the tops of your shoulders (shoulder strap areas).  You have shortness of breath. This information is not intended to replace advice given to you by your health care provider. Make sure you discuss any questions you have with your health care provider. Document Released: 01/20/2013 Document Revised: 09/07/2015 Document Reviewed: 10/29/2012 Elsevier Interactive Patient Education  2017 [facility] Instructions  Activity: Get plenty of rest for the remainder of the day. A responsible individual must stay with you for 24 hours following the procedure.  For the next 24 hours, DO NOT: -Drive a car -Paediatric nurse -Drink alcoholic beverages -Take any medication unless instructed by your physician -Make any legal decisions or sign important papers.  Meals: Start with liquid foods such as gelatin or soup. Progress to regular foods as tolerated. Avoid greasy, spicy, heavy foods. If nausea and/or vomiting occur, drink only clear liquids until the nausea and/or vomiting subsides. Call your physician if vomiting continues.  Special Instructions/Symptoms: Your throat may feel dry or sore from the anesthesia or the breathing tube placed in your throat during surgery. If this causes discomfort, gargle  with warm salt water. The discomfort should disappear within 24 hours. ° °If you had a scopolamine patch placed behind your ear for the management of post- operative nausea and/or vomiting: ° °1. The medication in the patch is effective for 72 hours, after which it should be removed.  Wrap patch in a tissue and discard in  the trash. Wash hands thoroughly with soap and water. °2. You may remove the patch earlier than 72 hours if you experience unpleasant side effects which may include dry mouth, dizziness or visual disturbances. °3. Avoid touching the patch. Wash your hands with soap and water after contact with the patch. °  ° ° °

## 2017-09-01 ENCOUNTER — Encounter (HOSPITAL_BASED_OUTPATIENT_CLINIC_OR_DEPARTMENT_OTHER): Payer: Self-pay | Admitting: Obstetrics and Gynecology

## 2017-10-04 IMAGING — RF DG HYSTEROGRAM
5 series · 5 of 5 positions shown · IV contrast (omnipaque)
Comparison: None.

CLINICAL DATA: Infertility

EXAM:
HYSTEROSALPINGOGRAM
TECHNIQUE: Following cleansing of the cervix and vagina with Betadine solution,
a hysterosalpingogram was performed using a 5-French
hysterosalpingogram catheter and Omnipaque 300 contrast. The patient
tolerated the examination without difficulty.

[Series 1: run · 1 of 1 slices shown (1 of 5)]
[im 1/1]
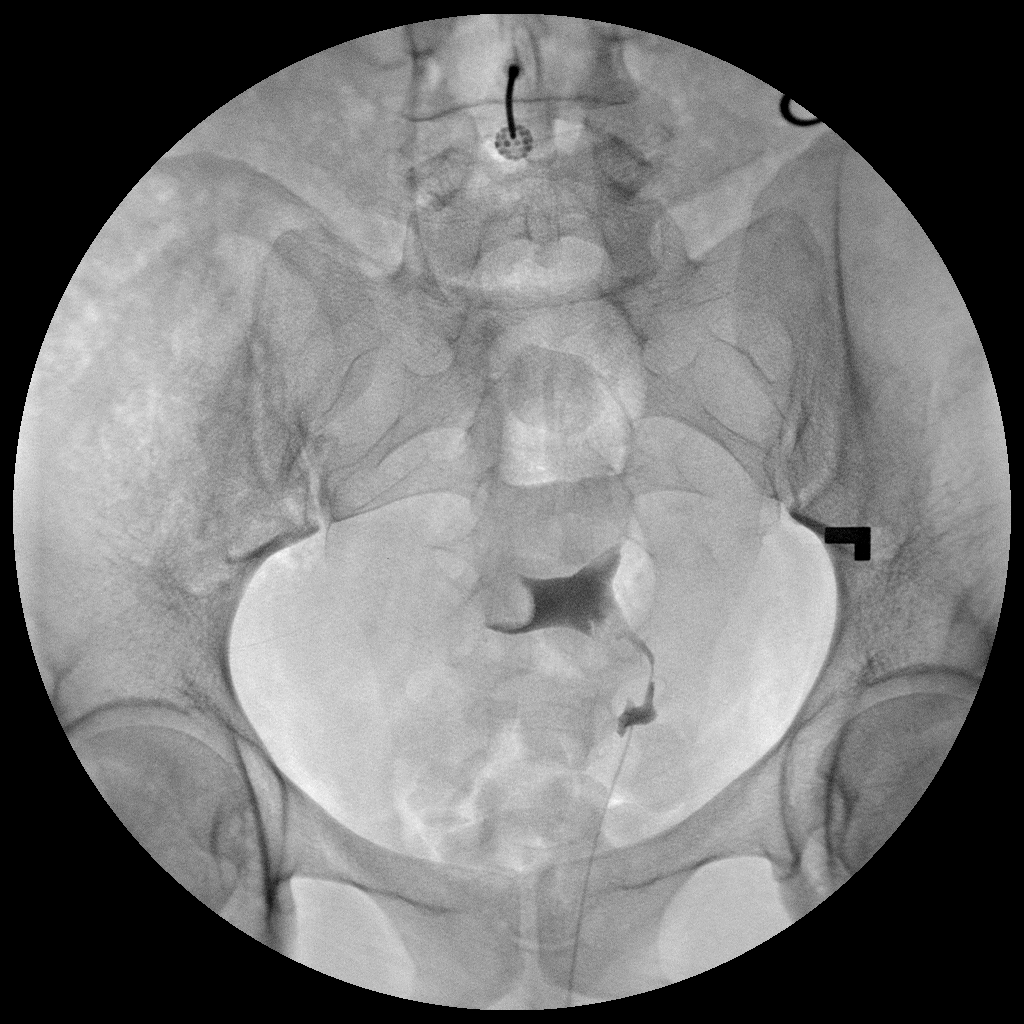

[Series 2: run · 1 of 1 slices shown (2 of 5)]
[im 1/1]
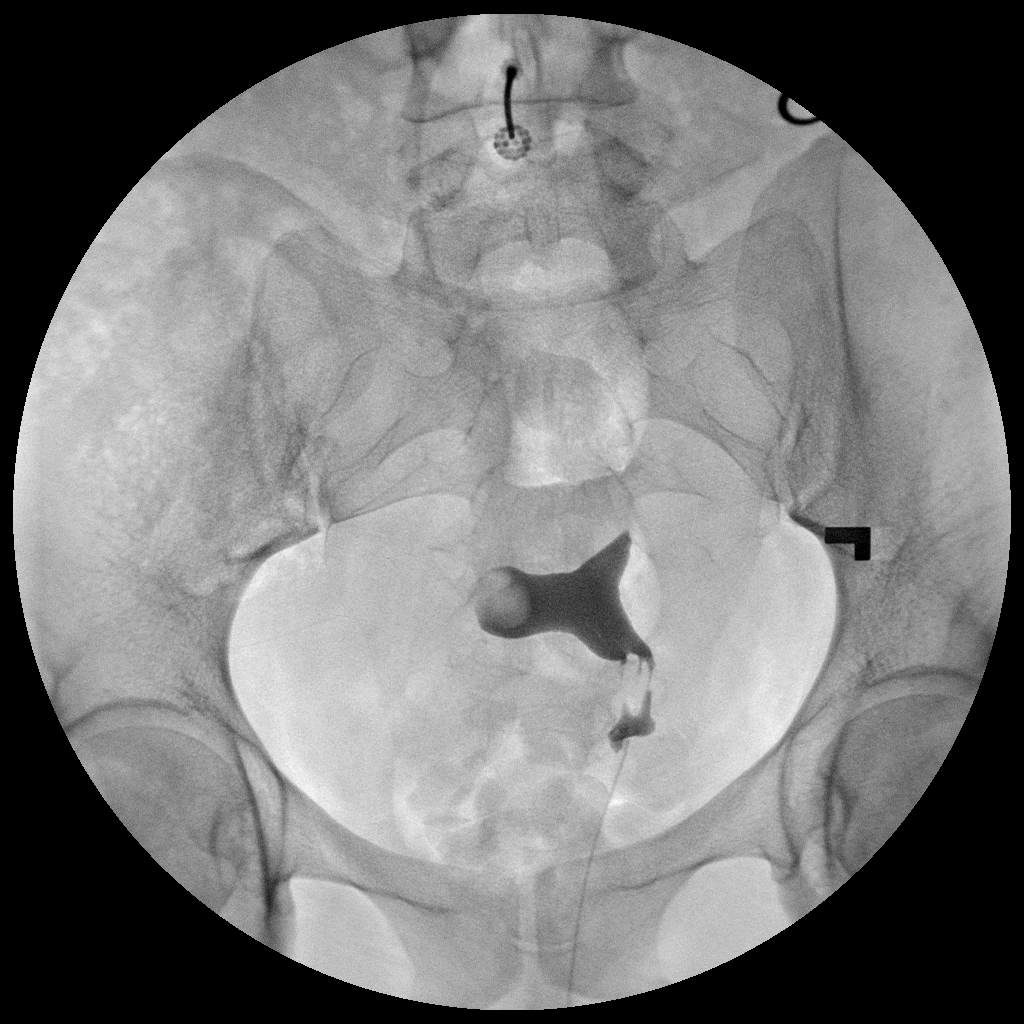

[Series 3: run · 1 of 1 slices shown (3 of 5)]
[im 1/1]
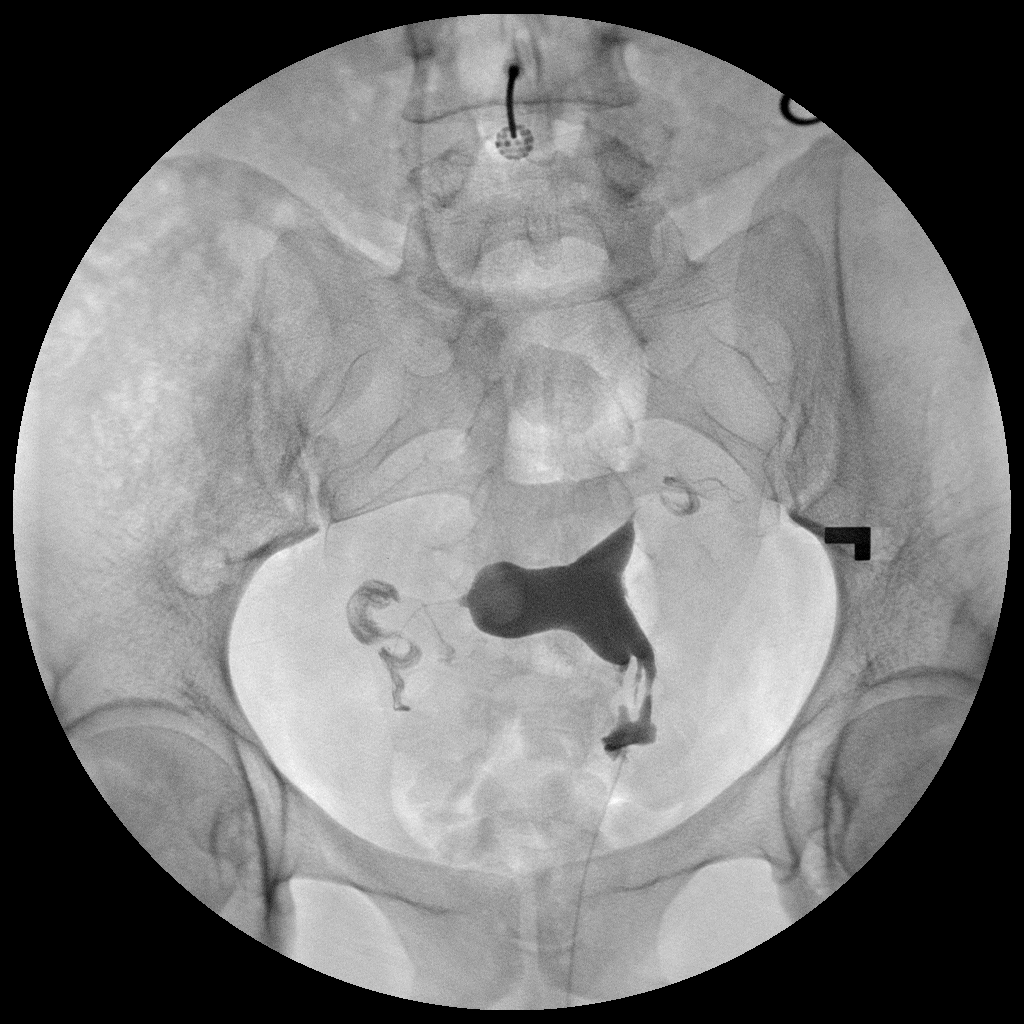

[Series 4: run · 1 of 1 slices shown (4 of 5)]
[im 1/1]
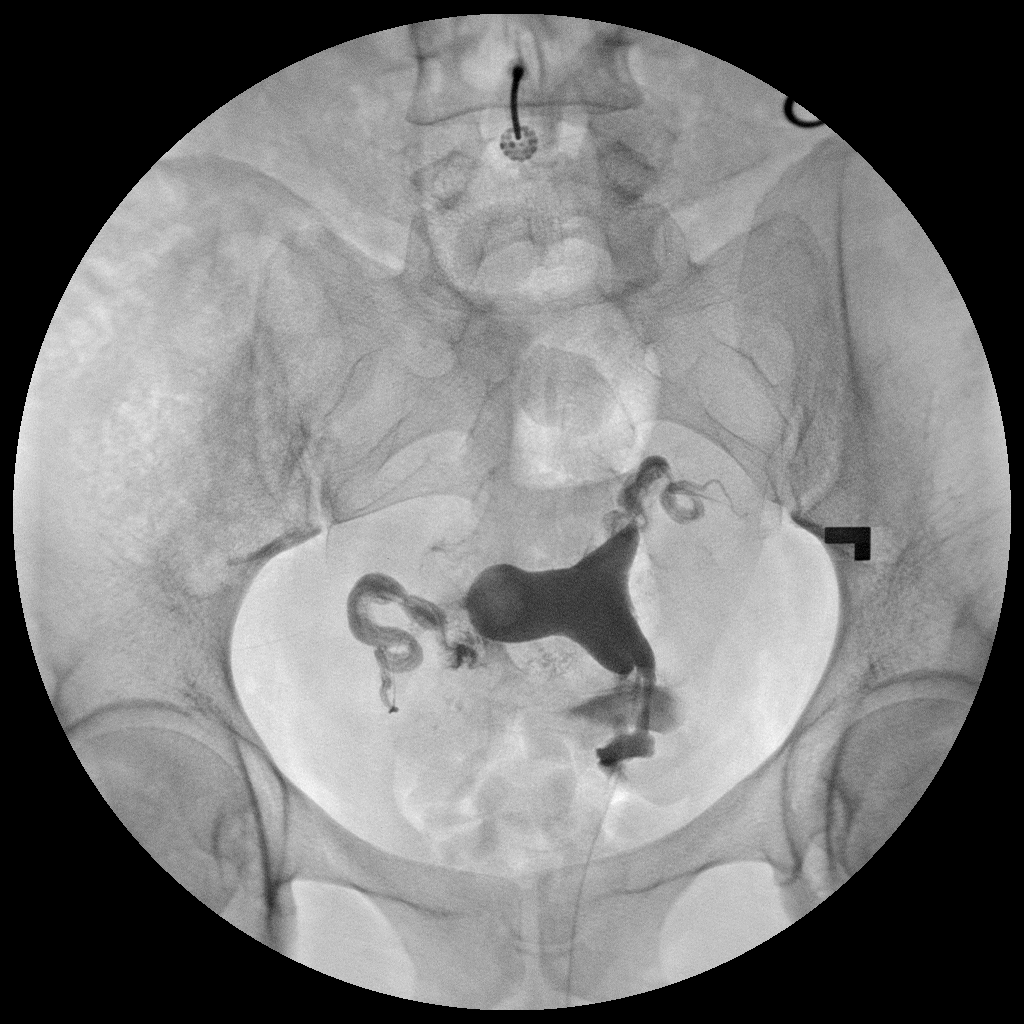

[Series 5: run · 1 of 1 slices shown (5 of 5)]
[im 1/1]
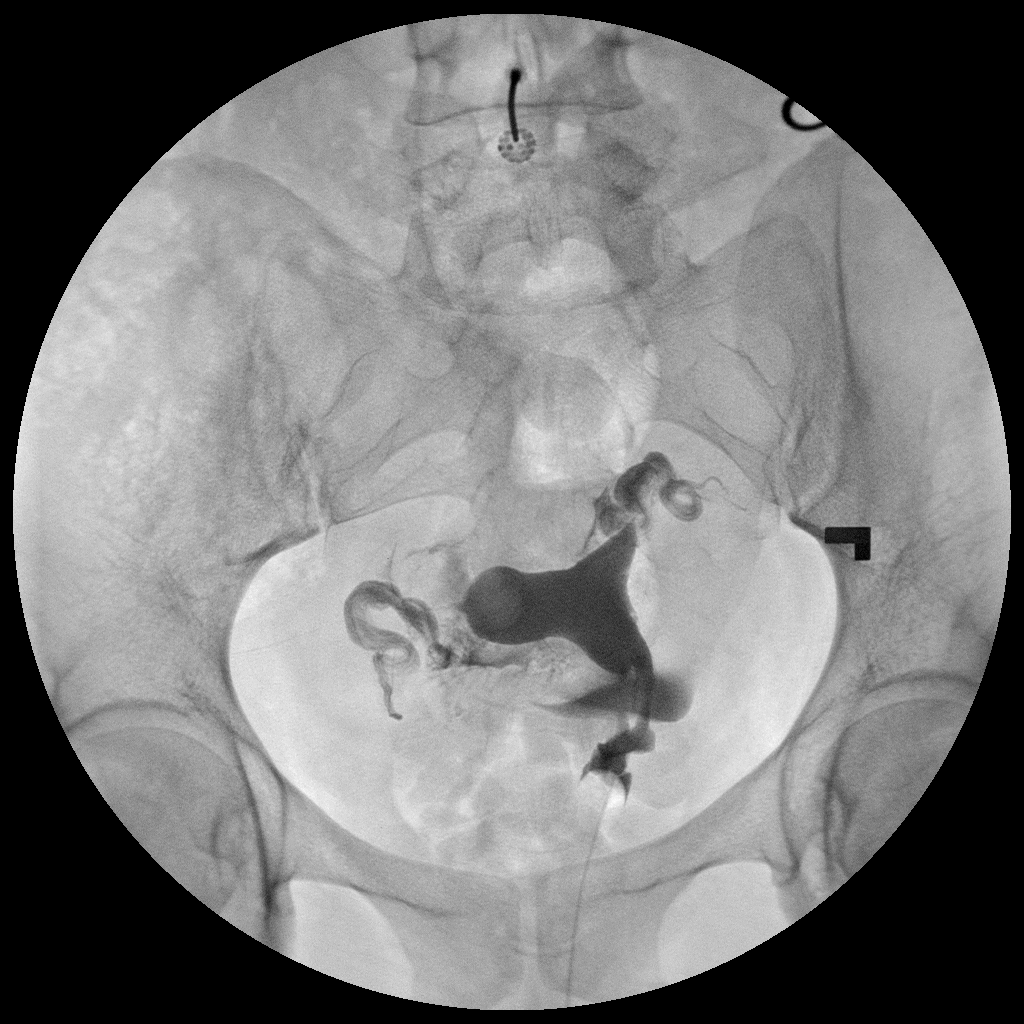

[5 of 5 positions shown; findings below may reference images not displayed]

FLUOROSCOPY TIME:  Radiation Exposure Index (as provided by the
fluoroscopic device):

If the device does not provide the exposure index:

Fluoroscopy Time:  1 minutes 12 seconds

Number of Acquired Images:  5
FINDINGS: There is a rounded filling defect within the endometrial canal on
the right near the right fallopian tube ostium. This likely reflects
a submucosal fibroid. Both fallopian tubes fill with normal spillage
bilaterally.
IMPRESSION: Rounded filling defect in the endometrium near the right fallopian
tube ostium, likely submucosal fibroid, possibly pedunculated.
Fallopian tubes are patent.

## 2019-08-31 ENCOUNTER — Other Ambulatory Visit (HOSPITAL_COMMUNITY): Payer: Self-pay | Admitting: Obstetrics and Gynecology

## 2019-08-31 ENCOUNTER — Other Ambulatory Visit: Payer: Self-pay | Admitting: Obstetrics and Gynecology

## 2019-08-31 DIAGNOSIS — E049 Nontoxic goiter, unspecified: Secondary | ICD-10-CM

## 2019-09-01 ENCOUNTER — Other Ambulatory Visit: Payer: Self-pay | Admitting: Obstetrics and Gynecology

## 2019-09-09 ENCOUNTER — Other Ambulatory Visit: Payer: Self-pay

## 2019-09-09 ENCOUNTER — Ambulatory Visit (HOSPITAL_COMMUNITY)
Admission: RE | Admit: 2019-09-09 | Discharge: 2019-09-09 | Disposition: A | Discharge status: Home / Self Care | Payer: 59 | Source: Ambulatory Visit | Attending: Obstetrics and Gynecology | Admitting: Obstetrics and Gynecology

## 2019-09-09 DIAGNOSIS — E049 Nontoxic goiter, unspecified: Secondary | ICD-10-CM | POA: Diagnosis present

## 2019-10-21 ENCOUNTER — Encounter: Payer: Self-pay | Admitting: Endocrinology

## 2019-10-21 LAB — T4
T4,Free (Direct): 1.24
TSH: 0.426

## 2019-11-08 ENCOUNTER — Ambulatory Visit: Payer: 59 | Admitting: Endocrinology

## 2019-11-12 ENCOUNTER — Encounter: Payer: Self-pay | Admitting: Endocrinology

## 2019-11-12 ENCOUNTER — Ambulatory Visit (INDEPENDENT_AMBULATORY_CARE_PROVIDER_SITE_OTHER): Payer: No Typology Code available for payment source | Admitting: Endocrinology

## 2019-11-12 ENCOUNTER — Other Ambulatory Visit: Payer: Self-pay

## 2019-11-12 DIAGNOSIS — E042 Nontoxic multinodular goiter: Secondary | ICD-10-CM

## 2019-11-12 NOTE — Patient Instructions (Addendum)
The thyroid needs no treatment now. most of the time, a "lumpy thyroid" will eventually become more overactive.  this is usually a slow process, happening over the span of many years.  We don't need to recheck the ultrasound.   Please come back for a follow-up appointment in 1 year, or sooner if the pregnancy happens.

## 2019-11-12 NOTE — Progress Notes (Signed)
Subjective:    Patient ID: Kelly Bender, female    DOB: Sep 23, 1980, 39 y.o.   MRN: [id number]  HPI Pt is referred by for nodular thyroid.  Pt says this was first noted in approx 2013, in [place].  Pt says she had [place] and nuc med scan. Pt says she was told results were [place].  she has no h/o XRT or surgery to the neck.  Main symptom is weight gain.  She is considering a pregnancy.   Past Medical History:  Diagnosis Date  . Submucous uterine fibroid     Past Surgical History:  Procedure Laterality Date  . CESAREAN SECTION  12/27/2008  . DILATATION & CURRETTAGE/HYSTEROSCOPY WITH RESECTOCOPE N/A 05/10/2015   Procedure: DILATATION & CURETTAGE/Hystroscopic Resection Submucosal Fibroids;  Surgeon: Ena Dawley, MD;  Location: [facility];  Service: Gynecology;  Laterality: N/A;  . DILATATION & CURRETTAGE/HYSTEROSCOPY WITH RESECTOCOPE N/A 08/29/2017   Procedure: DILATATION & CURETTAGE/HYSTEROSCOPY WITH RESECTOCOPE;  Surgeon: Ena Dawley, MD;  Location: [facility];  Service: Gynecology;  Laterality: N/A;  . TONSILLECTOMY  07-26-2016  dr Ilda Foil  [facility]    Social History   Socioeconomic History  . Marital status: Single    Spouse name: Not on file  . Number of children: Not on file  . Years of education: Not on file  . Highest education level: Not on file  Occupational History  . Not on file  Tobacco Use  . Smoking status: Former Smoker    Packs/day: 0.50    Years: 7.00    Pack years: 3.50    Types: Cigarettes    Quit date: 08/05/2017    Years since quitting: 2.2  . Smokeless tobacco: Never Used  . Tobacco comment: 08-25-2017 per pt quit approx. 08-05-2017  Vaping Use  . Vaping Use: Every day  Substance and Sexual Activity  . Alcohol use: No  . Drug use: No  . Sexual activity: Not on file  Other Topics Concern  . Not on file  Social History Narrative  . Not on file   Social Determinants of Health   Financial Resource Strain:   . Difficulty of Paying  Living Expenses:   Food Insecurity:   . Worried About Charity fundraiser in the Last Year:   . Arboriculturist in the Last Year:   Transportation Needs:   . Film/video editor (Medical):   Marland Kitchen Lack of Transportation (Non-Medical):   Physical Activity:   . Days of Exercise per Week:   . Minutes of Exercise per Session:   Stress:   . Feeling of Stress :   Social Connections:   . Frequency of Communication with Friends and Family:   . Frequency of Social Gatherings with Friends and Family:   . Attends Religious Services:   . Active Member of Clubs or Organizations:   . Attends Archivist Meetings:   Marland Kitchen Marital Status:   Intimate Partner Violence:   . Fear of Current or Ex-Partner:   . Emotionally Abused:   Marland Kitchen Physically Abused:   . Sexually Abused:     Current Outpatient Medications on File Prior to Visit  Medication Sig Dispense Refill  . BIOTIN PO Take 1 tablet by mouth daily.    . Multiple Vitamins-Minerals (MULTIVITAMIN PO) Take 1 tablet by mouth daily.    . vitamin C (ASCORBIC ACID) 500 MG tablet Take 500 mg by mouth daily.     No current facility-administered medications on file prior to  visit.    Allergies  Allergen Reactions  . No Known Allergies     Family History  Problem Relation Age of Onset  . Thyroid disease Neg Hx     BP 106/76   Pulse 74   Ht 5\' 2"  (1.575 m)   Wt 137 lb 3.2 oz (62.2 kg)   BMI 25.09 kg/m     Review of Systems denies palpitations, sob, muscle weakness, excessive diaphoresis, tremor, fatigue, anxiety, and heat intolerance.       Objective:   Physical Exam VITAL SIGNS:  See vs page GENERAL: no distress NECK: There is no palpable thyroid enlargement.  No thyroid nodule is palpable.  No palpable lymphadenopathy at the anterior neck. LUNGS:  Clear to auscultation HEART:  Regular rate and rhythm without murmurs noted. Normal S1,S2.   NEURO: no tremor SKIN: not diaphoretic   Lab Results  Component Value Date   TSH  0.426 08/30/2019   I have reviewed outside records, and summarized: Pt was noted to have elevated A1c, and referred here.  She was seen for routine well woman visit, and MNG was incidentally noted  [place]: 1. Mildly heterogeneous, mildly enlarged thyroid gland. 2. There are 2 distinct thyroid nodules labeled #2 and #4 as detailed above. A 1 year follow-up ultrasound is recommended for both of these thyroid nodules.     Assessment & Plan:  MNG, new to me, new to me.   Low-normal TSH: she is at risk for hyperthyroidism  Patient Instructions  The thyroid needs no treatment now. most of the time, a "lumpy thyroid" will eventually become more overactive.  this is usually a slow process, happening over the span of many years.  We don't need to recheck the ultrasound.   Please come back for a follow-up appointment in 1 year, or sooner if the pregnancy happens.

## 2019-11-14 DIAGNOSIS — E042 Nontoxic multinodular goiter: Secondary | ICD-10-CM | POA: Insufficient documentation

## 2019-11-22 ENCOUNTER — Ambulatory Visit: Payer: 59 | Admitting: Endocrinology

## 2020-10-13 ENCOUNTER — Other Ambulatory Visit: Payer: Self-pay | Admitting: Obstetrics and Gynecology

## 2020-10-13 ENCOUNTER — Other Ambulatory Visit (HOSPITAL_COMMUNITY): Payer: Self-pay | Admitting: Obstetrics and Gynecology

## 2020-10-13 DIAGNOSIS — E041 Nontoxic single thyroid nodule: Secondary | ICD-10-CM

## 2020-10-23 ENCOUNTER — Other Ambulatory Visit: Payer: Self-pay

## 2020-10-23 ENCOUNTER — Ambulatory Visit (HOSPITAL_COMMUNITY)
Admission: RE | Admit: 2020-10-23 | Discharge: 2020-10-23 | Disposition: A | Discharge status: Home / Self Care | Payer: No Typology Code available for payment source | Source: Ambulatory Visit | Attending: Obstetrics and Gynecology | Admitting: Obstetrics and Gynecology

## 2020-10-23 DIAGNOSIS — E041 Nontoxic single thyroid nodule: Secondary | ICD-10-CM | POA: Diagnosis present

## 2020-11-06 ENCOUNTER — Other Ambulatory Visit: Payer: Self-pay | Admitting: Obstetrics and Gynecology

## 2020-11-06 DIAGNOSIS — R928 Other abnormal and inconclusive findings on diagnostic imaging of breast: Secondary | ICD-10-CM

## 2020-11-09 ENCOUNTER — Ambulatory Visit: Payer: No Typology Code available for payment source | Admitting: Endocrinology

## 2021-11-08 ENCOUNTER — Other Ambulatory Visit: Payer: Self-pay | Admitting: Obstetrics and Gynecology

## 2021-11-08 DIAGNOSIS — E049 Nontoxic goiter, unspecified: Secondary | ICD-10-CM

## 2021-12-05 IMAGING — US US THYROID
1 series · 13 of 25 positions shown · non-contrast
Comparison: 09/09/2019

CLINICAL DATA: Thyroid nodule follow-up

EXAM:
THYROID ULTRASOUND
TECHNIQUE: Ultrasound examination of the thyroid gland and adjacent soft
tissues was performed.

[Series 1: us thyroid · 61 acquisitions, 13 frames shown]
[im 1/61]
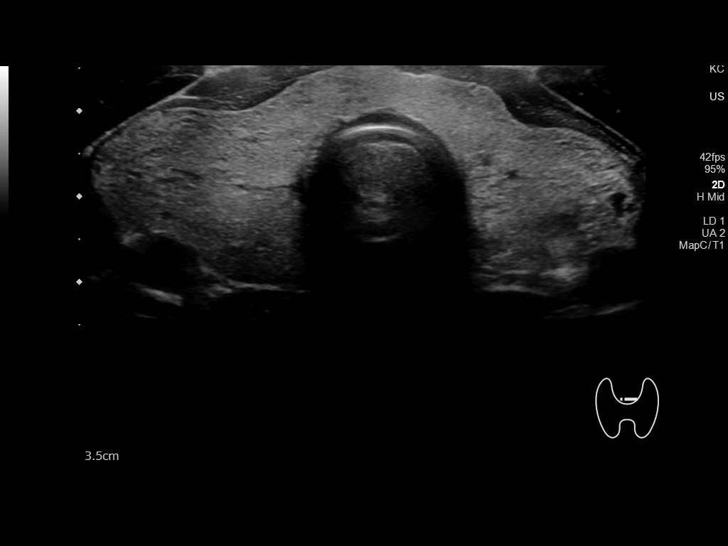
[im 6/61]
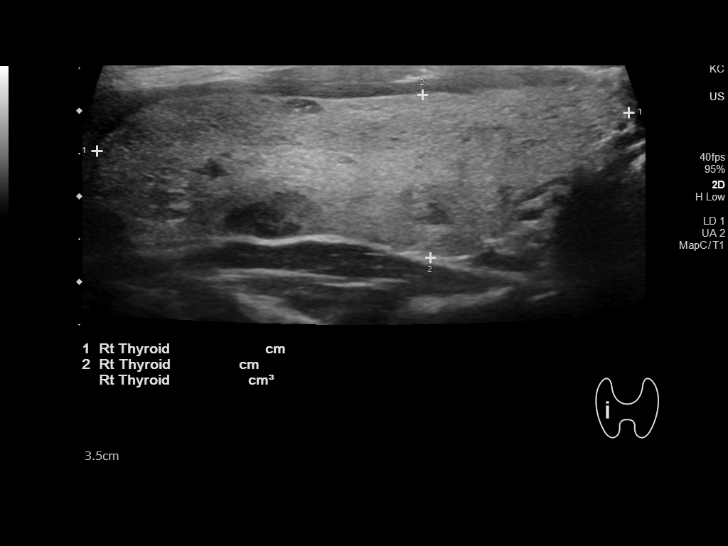
[im 11/61]
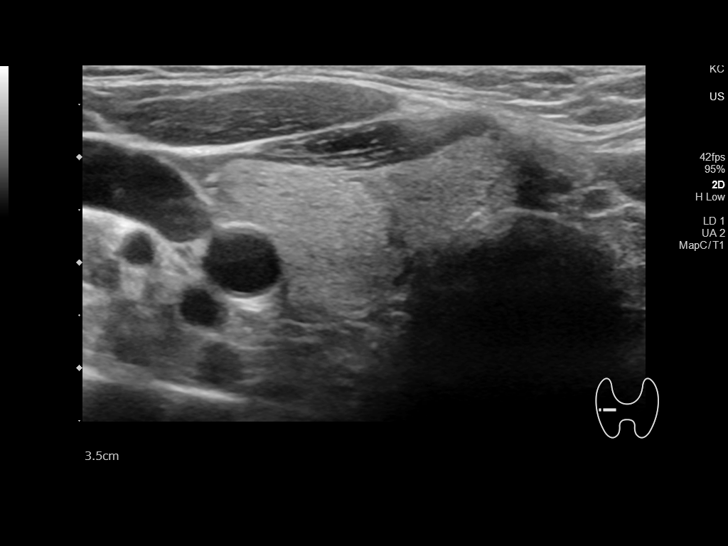
[im 16/61]
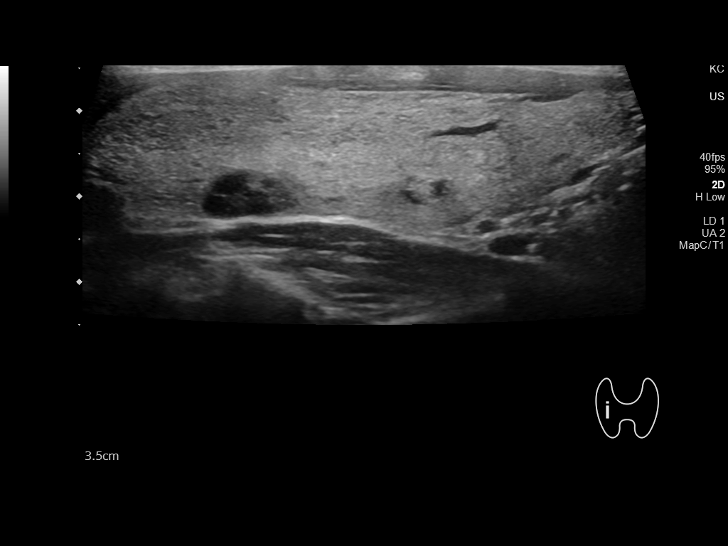
[im 21/61]
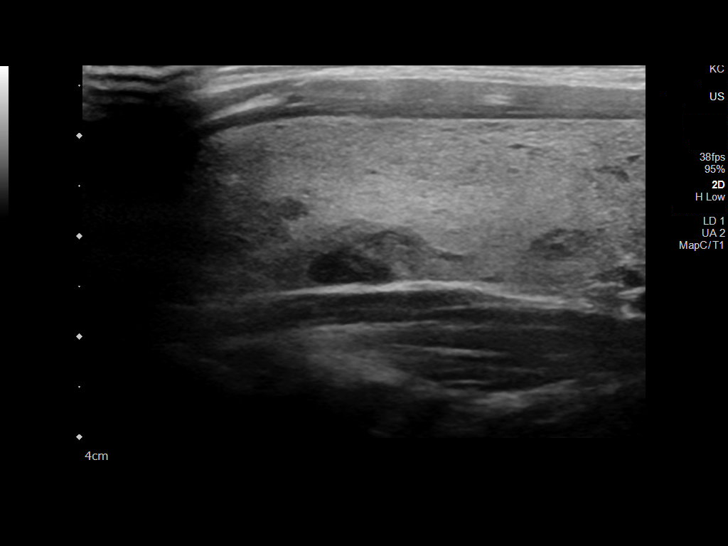
[im 26/61]
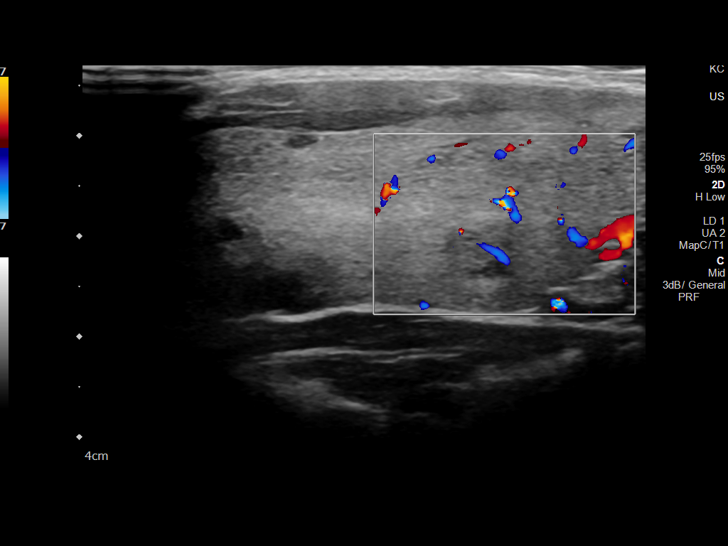
[im 31/61]
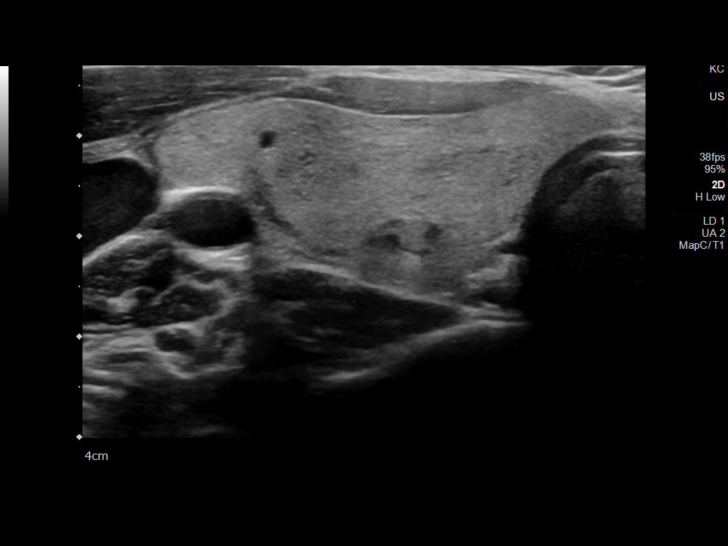
[im 36/61]
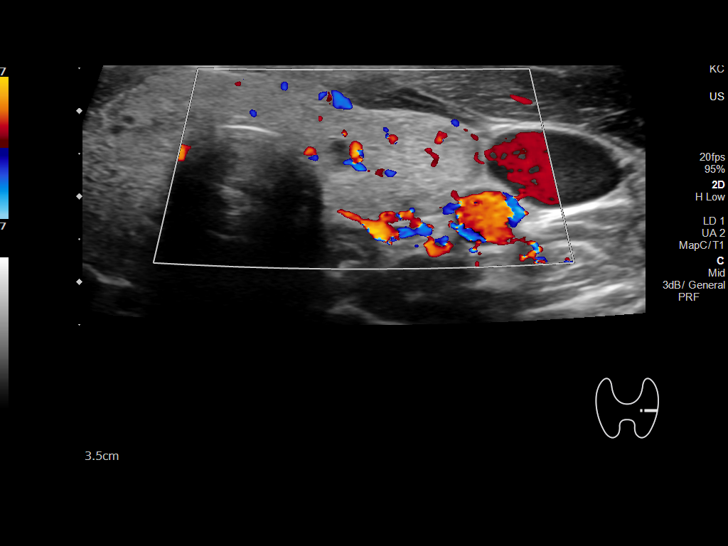
[im 41/61]
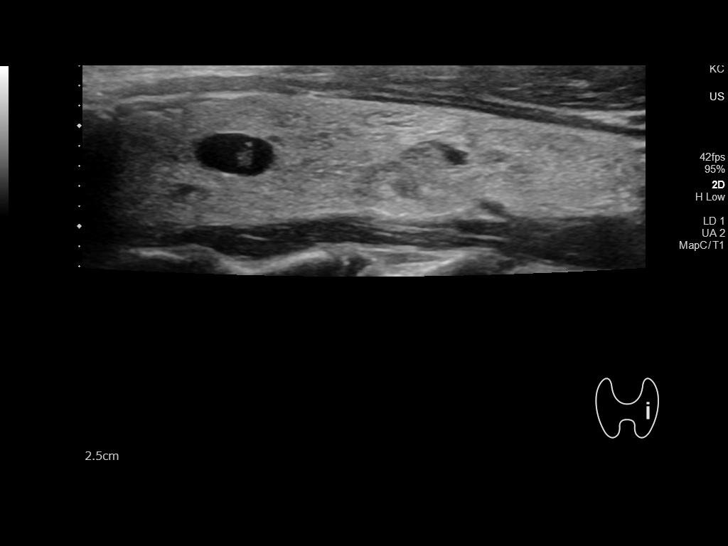
[im 46/61]
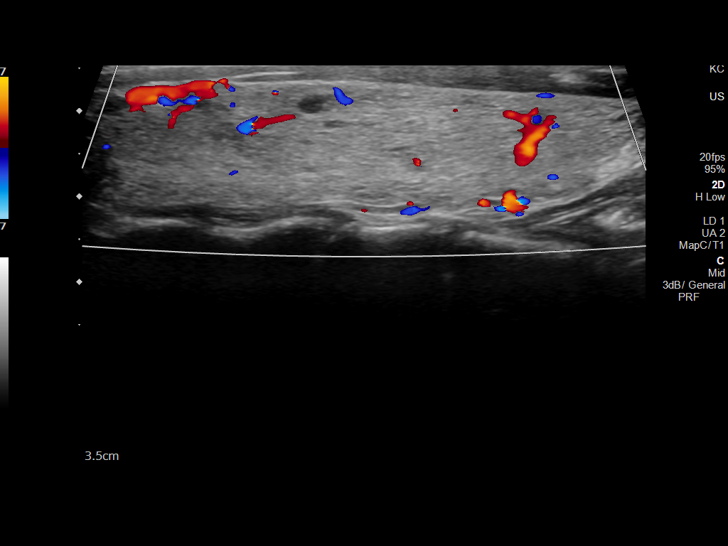
[im 51/61]
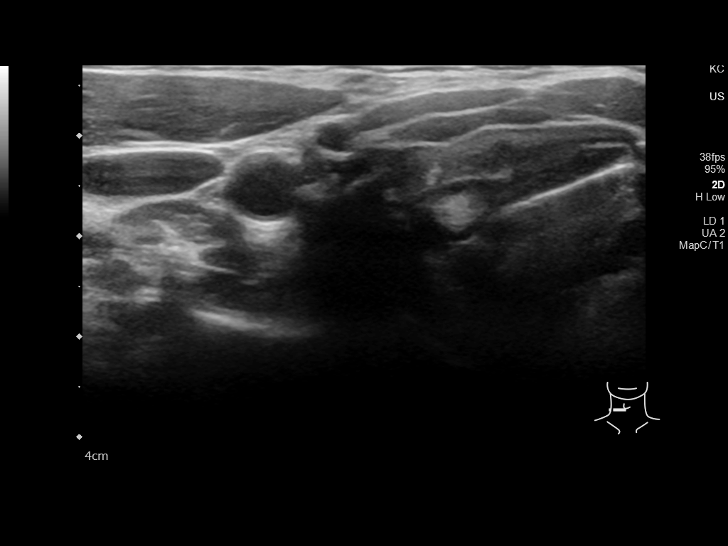
[im 56/61]
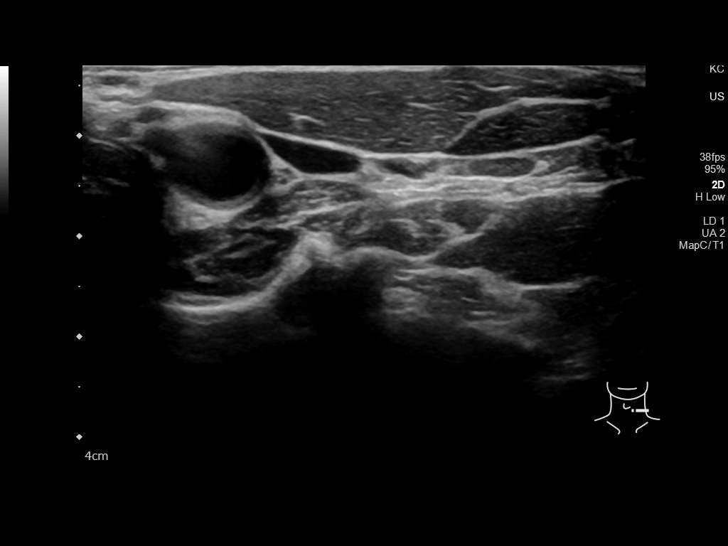
[im 61/61]
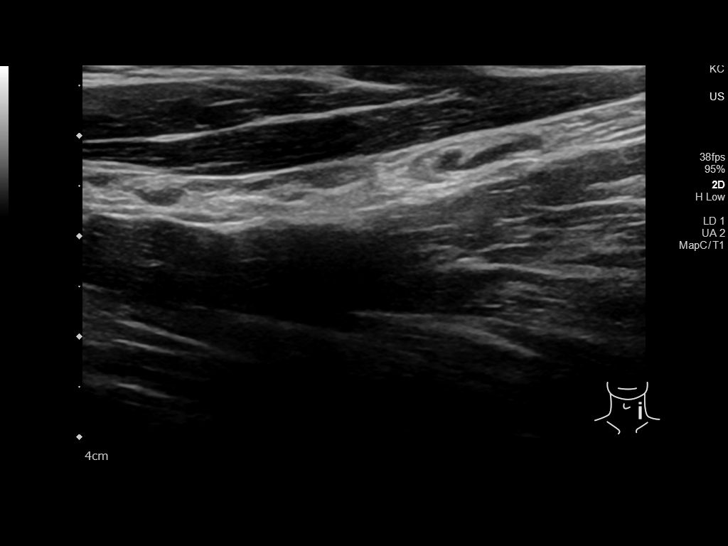

[13 of 25 positions shown; findings below may reference images not displayed]

FINDINGS: Parenchymal Echotexture: Mildly heterogenous

Isthmus: 7 mm

Right lobe: 6.2 x 1.9 x 2.5 cm

Left lobe: 5.8 x 1.4 x 2.2 cm

_________________________________________________________

Estimated total number of nodules >/= 1 cm: 1

Number of spongiform nodules >/=  2 cm not described below (TR1): 0

Number of mixed cystic and solid nodules >/= 1.5 cm not described
below (TR2): 0

_________________________________________________________

Nodule # 1:

Location: Right; Mid

Maximum size: 1.2, previously 1.4 cm; Other 2 dimensions: 0.6 x
cm

Composition: solid/almost completely solid (2)

Echogenicity: hypoechoic (2)

Shape: not taller-than-wide (0)

Margins: smooth (0)

Echogenic foci: none (0)

ACR TI-RADS total points: 4.

ACR TI-RADS risk category: TR4 (4-6 points).

ACR TI-RADS recommendations:

*Given size (>/= 1 - 1.4 cm) and appearance, a follow-up ultrasound
in 1 year should be considered based on TI-RADS criteria.

_________________________________________________________

There are additional scattered bilateral isoechoic and cystic
nodules noted, all measuring 8 mm or less in size. These would not
meet criteria for any biopsy or follow-up and are not fully
described by TI rads criteria.

No hypervascularity or regional adenopathy.
IMPRESSION: 1.2 cm right mid thyroid TR 4 nodule meets criteria follow-up in 1
year.

Additional subcentimeter nodules noted.

The above is in keeping with the ACR TI-RADS recommendations - [HOSPITAL] 3495;[DATE].

## 2022-11-13 ENCOUNTER — Other Ambulatory Visit: Payer: Self-pay | Admitting: Obstetrics and Gynecology

## 2022-11-13 DIAGNOSIS — E041 Nontoxic single thyroid nodule: Secondary | ICD-10-CM

## 2022-11-19 ENCOUNTER — Other Ambulatory Visit: Payer: No Typology Code available for payment source

## 2023-01-07 ENCOUNTER — Other Ambulatory Visit: Payer: No Typology Code available for payment source

## 2023-01-09 ENCOUNTER — Ambulatory Visit
Admission: RE | Admit: 2023-01-09 | Discharge: 2023-01-09 | Disposition: A | Discharge status: Home / Self Care | Payer: 59 | Source: Ambulatory Visit | Attending: Obstetrics and Gynecology | Admitting: Obstetrics and Gynecology

## 2023-01-09 DIAGNOSIS — E041 Nontoxic single thyroid nodule: Secondary | ICD-10-CM

## 2023-06-02 ENCOUNTER — Other Ambulatory Visit: Payer: Self-pay | Admitting: Obstetrics and Gynecology

## 2023-06-05 LAB — SURGICAL PATHOLOGY

## 2023-07-18 ENCOUNTER — Other Ambulatory Visit: Payer: Self-pay | Admitting: Obstetrics and Gynecology

## 2023-07-23 ENCOUNTER — Other Ambulatory Visit: Payer: Self-pay | Admitting: Obstetrics and Gynecology

## 2023-07-23 LAB — SURGICAL PATHOLOGY

## 2023-08-20 NOTE — Pre-Procedure Instructions (Signed)
 Surgical Instructions   Your procedure is scheduled on Aug 28, 2023. Report to [facility] Main Entrance "A" at 7:30 A.M., then check in with the Admitting office. Any questions or running late day of surgery: call [phone]  Questions prior to your surgery date: call [phone], Monday-Friday, 8am-4pm. If you experience any cold or flu symptoms such as cough, fever, chills, shortness of breath, etc. between now and your scheduled surgery, please notify us  at the above number.     Remember:  Do not eat after midnight the night before your surgery   You may drink clear liquids until 6:30 AM the morning of your surgery.   Clear liquids allowed are: Water, Non-Citrus Juices (without pulp), Carbonated Beverages, Clear Tea (no milk, honey, etc.), Black Coffee Only (NO MILK, CREAM OR POWDERED CREAMER of any kind), and Gatorade.    Take these medicines the morning of surgery with A SIP OF WATER: NONE   One week prior to surgery, STOP taking any Aspirin (unless otherwise instructed by your surgeon) Aleve, Naproxen, Ibuprofen , Motrin , Advil , Goody's, BC's, all herbal medications, fish oil, and non-prescription vitamins.                     Do NOT Smoke (Tobacco/Vaping) for 24 hours prior to your procedure.  If you use a CPAP at night, you may bring your mask/headgear for your overnight stay.   You will be asked to remove any contacts, glasses, piercing's, hearing aid's, dentures/partials prior to surgery. Please bring cases for these items if needed.    Patients discharged the day of surgery will not be allowed to drive home, and someone needs to stay with them for 24 hours.  SURGICAL WAITING ROOM VISITATION Patients may have no more than 2 support people in the waiting area - these visitors may rotate.   Pre-op nurse will coordinate an appropriate time for 1 ADULT support person, who may not rotate, to accompany patient in pre-op.  Children under the age of 16 must have an adult with  them who is not the patient and must remain in the main waiting area with an adult.  If the patient needs to stay at the hospital during part of their recovery, the visitor guidelines for inpatient rooms apply.  Please refer to the [facility] website for the visitor guidelines for any additional information.   If you received a COVID test during your pre-op visit  it is requested that you wear a mask when out in public, stay away from anyone that may not be feeling well and notify your surgeon if you develop symptoms. If you have been in contact with anyone that has tested positive in the last 10 days please notify you surgeon.      Pre-operative CHG Bathing Instructions   You can play a key role in reducing the risk of infection after surgery. Your skin needs to be as free of germs as possible. You can reduce the number of germs on your skin by washing with CHG (chlorhexidine  gluconate) soap before surgery. CHG is an antiseptic soap that kills germs and continues to kill germs even after washing.   DO NOT use if you have an allergy to chlorhexidine /CHG or antibacterial soaps. If your skin becomes reddened or irritated, stop using the CHG and notify one of our RNs at [phone].              TAKE A SHOWER THE NIGHT BEFORE SURGERY AND THE DAY OF SURGERY  Please keep in mind the following:  DO NOT shave, including legs and underarms, 48 hours prior to surgery.   You may shave your face before/day of surgery.  Place clean sheets on your bed the night before surgery Use a clean washcloth (not used since being washed) for each shower. DO NOT sleep with pet's night before surgery.  CHG Shower Instructions:  Wash your face and private area with normal soap. If you choose to wash your hair, wash first with your normal shampoo.  After you use shampoo/soap, rinse your hair and body thoroughly to remove shampoo/soap residue.  Turn the water OFF and apply half the bottle of CHG soap to a CLEAN  washcloth.  Apply CHG soap ONLY FROM YOUR NECK DOWN TO YOUR TOES (washing for 3-5 minutes)  DO NOT use CHG soap on face, private areas, open wounds, or sores.  Pay special attention to the area where your surgery is being performed.  If you are having back surgery, having someone wash your back for you may be helpful. Wait 2 minutes after CHG soap is applied, then you may rinse off the CHG soap.  Pat dry with a clean towel  Put on clean pajamas    Additional instructions for the day of surgery: DO NOT APPLY any lotions, deodorants, cologne, or perfumes.   Do not wear jewelry or makeup Do not wear nail polish, gel polish, artificial nails, or any other type of covering on natural nails (fingers and toes) Do not bring valuables to the hospital. [facility] is not responsible for valuables/personal belongings. Put on clean/comfortable clothes.  Please brush your teeth.  Ask your nurse before applying any prescription medications to the skin.

## 2023-08-21 ENCOUNTER — Other Ambulatory Visit: Payer: Self-pay

## 2023-08-21 ENCOUNTER — Encounter (HOSPITAL_COMMUNITY): Payer: Self-pay

## 2023-08-21 ENCOUNTER — Encounter (HOSPITAL_COMMUNITY)
Admission: RE | Admit: 2023-08-21 | Discharge: 2023-08-21 | Disposition: A | Discharge status: Home / Self Care | Source: Ambulatory Visit | Attending: Obstetrics and Gynecology | Admitting: Obstetrics and Gynecology

## 2023-08-21 DIAGNOSIS — Z01812 Encounter for preprocedural laboratory examination: Secondary | ICD-10-CM | POA: Insufficient documentation

## 2023-08-21 LAB — CBC
HCT: 39.3 % (ref 36.0–46.0)
Hemoglobin: 12.7 g/dL (ref 12.0–15.0)
MCH: 27.8 pg (ref 26.0–34.0)
MCHC: 32.3 g/dL (ref 30.0–36.0)
MCV: 86 fL (ref 80.0–100.0)
Platelets: 294 10*3/uL (ref 150–400)
RBC: 4.57 MIL/uL (ref 3.87–5.11)
RDW: 13.2 % (ref 11.5–15.5)
WBC: 5.7 10*3/uL (ref 4.0–10.5)
nRBC: 0 % (ref 0.0–0.2)

## 2023-08-21 LAB — TYPE AND SCREEN
ABO/RH(D): O POS
Antibody Screen: NEGATIVE

## 2023-08-21 NOTE — Progress Notes (Addendum)
 PCP - Dr. Renea Carrion Cardiologist - denies  PPM/ICD - denies Device Orders - na Rep Notified - na  Chest x-ray - na EKG - na Stress Test -  ECHO -  Cardiac Cath -   Sleep Study - denies CPAP - na  Non-diabetic  Blood Thinner Instructions:denies Aspirin Instructions:denies  ERAS Protcol -Clears until 0630  Anesthesia review: No  Patient denies shortness of breath, fever, cough and chest pain at PAT appointment   All instructions explained to the patient, with a verbal understanding of the material. Patient agrees to go over the instructions while at home for a better understanding. Patient also instructed to self quarantine after being tested for COVID-19. The opportunity to ask questions was provided.

## 2023-08-22 ENCOUNTER — Other Ambulatory Visit (HOSPITAL_COMMUNITY): Payer: Self-pay

## 2023-08-27 NOTE — Progress Notes (Signed)
 Patient was called to be informed that the surgery time for tomorrow was changed to 10:01 o'clock. Patient was instructed to be at the hospital at 08:00 o'clock and to stop drinking clear liquids at 07:00 o'clock. Patient verbalized understanding.

## 2023-08-27 NOTE — Anesthesia Preprocedure Evaluation (Signed)
 Anesthesia Evaluation  Patient identified by MRN, date of birth, ID band Patient awake    Reviewed: Allergy & Precautions, NPO status , Patient's Chart, lab work & pertinent test results  History of Anesthesia Complications Negative for: history of anesthetic complications  Airway Mallampati: II  TM Distance: >3 FB Neck ROM: Full    Dental  (+) Dental Advisory Given   Pulmonary neg shortness of breath, neg sleep apnea, neg COPD, neg recent URI, former smoker   Pulmonary exam normal breath sounds clear to auscultation       Cardiovascular negative cardio ROS  Rhythm:Regular Rate:Normal     Neuro/Psych negative neurological ROS     GI/Hepatic negative GI ROS, Neg liver ROS,,,  Endo/Other  neg diabetes  Multinodular goiter  Renal/GU negative Renal ROS     Musculoskeletal   Abdominal   Peds  Hematology negative hematology ROS (+) Lab Results      Component                Value               Date                      WBC                      5.7                 08/21/2023                HGB                      12.7                08/21/2023                HCT                      39.3                08/21/2023                MCV                      86.0                08/21/2023                PLT                      294                 08/21/2023              Anesthesia Other Findings   Reproductive/Obstetrics fibroid                             Anesthesia Physical Anesthesia Plan  ASA: 2  Anesthesia Plan: General   Post-op Pain Management: Tylenol  PO (pre-op)*   Induction: Intravenous  PONV Risk Score and Plan: 3 and Ondansetron , Dexamethasone , Midazolam  and Treatment may vary due to age or medical condition  Airway Management Planned: LMA  Additional Equipment:   Intra-op Plan:   Post-operative Plan: Extubation in OR  Informed Consent: I have reviewed the patients  History and Physical, chart, labs and discussed the procedure including  the risks, benefits and alternatives for the proposed anesthesia with the patient or authorized representative who has indicated his/her understanding and acceptance.     Dental advisory given  Plan Discussed with: CRNA and Anesthesiologist  Anesthesia Plan Comments: (Risks of general anesthesia discussed including, but not limited to, sore throat, hoarse voice, chipped/damaged teeth, injury to vocal cords, nausea and vomiting, allergic reactions, lung infection, heart attack, stroke, and death. All questions answered. )       Anesthesia Quick Evaluation

## 2023-08-28 ENCOUNTER — Other Ambulatory Visit: Payer: Self-pay

## 2023-08-28 ENCOUNTER — Ambulatory Visit (HOSPITAL_COMMUNITY)
Admission: RE | Admit: 2023-08-28 | Discharge: 2023-08-28 | Disposition: A | Discharge status: Home / Self Care | Payer: Self-pay | Attending: Obstetrics and Gynecology | Admitting: Obstetrics and Gynecology

## 2023-08-28 ENCOUNTER — Ambulatory Visit (HOSPITAL_COMMUNITY): Payer: Self-pay | Admitting: Anesthesiology

## 2023-08-28 ENCOUNTER — Encounter (HOSPITAL_COMMUNITY)
Admission: RE | Disposition: A | Discharge status: Home / Self Care | Payer: Self-pay | Source: Home / Self Care | Attending: Obstetrics and Gynecology

## 2023-08-28 ENCOUNTER — Encounter (HOSPITAL_COMMUNITY): Payer: Self-pay | Admitting: Obstetrics and Gynecology

## 2023-08-28 ENCOUNTER — Ambulatory Visit (HOSPITAL_BASED_OUTPATIENT_CLINIC_OR_DEPARTMENT_OTHER): Payer: Self-pay | Admitting: Anesthesiology

## 2023-08-28 DIAGNOSIS — D259 Leiomyoma of uterus, unspecified: Secondary | ICD-10-CM | POA: Diagnosis present

## 2023-08-28 DIAGNOSIS — D250 Submucous leiomyoma of uterus: Secondary | ICD-10-CM

## 2023-08-28 DIAGNOSIS — Z87891 Personal history of nicotine dependence: Secondary | ICD-10-CM | POA: Insufficient documentation

## 2023-08-28 DIAGNOSIS — N920 Excessive and frequent menstruation with regular cycle: Secondary | ICD-10-CM | POA: Diagnosis not present

## 2023-08-28 HISTORY — PX: HYSTEROSCOPY: SHX211

## 2023-08-28 HISTORY — PX: DILATATION & CURRETTAGE/HYSTEROSCOPY WITH RESECTOCOPE: SHX5572

## 2023-08-28 LAB — POCT PREGNANCY, URINE: Preg Test, Ur: NEGATIVE

## 2023-08-28 LAB — ABO/RH: ABO/RH(D): O POS

## 2023-08-28 SURGERY — DILATATION & CURETTAGE/HYSTEROSCOPY WITH RESECTOCOPE
Anesthesia: General | Site: Vagina

## 2023-08-28 MED ORDER — DOXYCYCLINE HYCLATE 100 MG PO TABS
100.0000 mg | ORAL_TABLET | Freq: Two times a day (BID) | ORAL | Status: AC
  Administered 2023-08-28: 100 mg via ORAL

## 2023-08-28 MED ORDER — OXYCODONE HCL 5 MG PO TABS
5.0000 mg | ORAL_TABLET | Freq: Four times a day (QID) | ORAL | 0 refills | Status: AC | PRN
Start: 2023-08-28 — End: ?

## 2023-08-28 MED ORDER — ORAL CARE MOUTH RINSE: 15.0000 mL | Freq: Once | OROMUCOSAL | Status: DC

## 2023-08-28 MED ORDER — OXYCODONE HCL 5 MG/5ML PO SOLN: 5.0000 mg | Freq: Once | ORAL | Status: DC | PRN

## 2023-08-28 MED ORDER — DEXAMETHASONE SODIUM PHOSPHATE 10 MG/ML IJ SOLN
INTRAMUSCULAR | Status: AC
  Filled 2023-08-28: qty 1

## 2023-08-28 MED ORDER — PROPOFOL 500 MG/50ML IV EMUL
INTRAVENOUS | Status: DC | PRN
  Administered 2023-08-28: 150 ug/kg/min via INTRAVENOUS

## 2023-08-28 MED ORDER — ALBUMIN HUMAN 5 % IV SOLN: INTRAVENOUS | Status: DC | PRN

## 2023-08-28 MED ORDER — OXYCODONE HCL 5 MG PO TABS: 5.0000 mg | ORAL_TABLET | Freq: Once | ORAL | Status: DC | PRN

## 2023-08-28 MED ORDER — LIDOCAINE HCL 1 % IJ SOLN
INTRAMUSCULAR | Status: AC
  Filled 2023-08-28: qty 20

## 2023-08-28 MED ORDER — KETAMINE HCL 50 MG/5ML IJ SOSY
PREFILLED_SYRINGE | INTRAMUSCULAR | Status: AC
  Filled 2023-08-28: qty 5

## 2023-08-28 MED ORDER — HYDROMORPHONE HCL 1 MG/ML IJ SOLN
INTRAMUSCULAR | Status: DC | PRN
  Administered 2023-08-28: .5 mg via INTRAVENOUS

## 2023-08-28 MED ORDER — DEXAMETHASONE SODIUM PHOSPHATE 10 MG/ML IJ SOLN
INTRAMUSCULAR | Status: DC | PRN
  Administered 2023-08-28: 10 mg via INTRAVENOUS

## 2023-08-28 MED ORDER — 0.9 % SODIUM CHLORIDE (POUR BTL) OPTIME
TOPICAL | Status: DC | PRN
  Administered 2023-08-28: 1000 mL

## 2023-08-28 MED ORDER — IBUPROFEN 600 MG PO TABS: 600.0000 mg | ORAL_TABLET | Freq: Four times a day (QID) | ORAL | 1 refills | Status: AC | PRN

## 2023-08-28 MED ORDER — KETOROLAC TROMETHAMINE 30 MG/ML IJ SOLN
INTRAMUSCULAR | Status: DC | PRN
  Administered 2023-08-28: 30 mg via INTRAVENOUS

## 2023-08-28 MED ORDER — FENTANYL CITRATE (PF) 100 MCG/2ML IJ SOLN: 25.0000 ug | INTRAMUSCULAR | Status: DC | PRN

## 2023-08-28 MED ORDER — ONDANSETRON HCL 4 MG/2ML IJ SOLN
INTRAMUSCULAR | Status: AC
  Filled 2023-08-28: qty 2

## 2023-08-28 MED ORDER — CHLORHEXIDINE GLUCONATE 0.12 % MT SOLN: 15.0000 mL | Freq: Once | OROMUCOSAL | Status: DC

## 2023-08-28 MED ORDER — LIDOCAINE 2% (20 MG/ML) 5 ML SYRINGE
INTRAMUSCULAR | Status: DC | PRN
  Administered 2023-08-28: 80 mg via INTRAVENOUS

## 2023-08-28 MED ORDER — FENTANYL CITRATE (PF) 250 MCG/5ML IJ SOLN
INTRAMUSCULAR | Status: DC | PRN
  Administered 2023-08-28 (×2): 25 ug via INTRAVENOUS
  Administered 2023-08-28 (×4): 50 ug via INTRAVENOUS

## 2023-08-28 MED ORDER — CHLORHEXIDINE GLUCONATE 0.12 % MT SOLN
15.0000 mL | Freq: Once | OROMUCOSAL | Status: AC
  Administered 2023-08-28: 15 mL via OROMUCOSAL
  Filled 2023-08-28: qty 15

## 2023-08-28 MED ORDER — PROPOFOL 10 MG/ML IV BOLUS
INTRAVENOUS | Status: AC
  Filled 2023-08-28: qty 20

## 2023-08-28 MED ORDER — SODIUM CHLORIDE 0.9 % IR SOLN
Status: DC | PRN
  Administered 2023-08-28 (×4): 3000 mL

## 2023-08-28 MED ORDER — LACTATED RINGERS IV SOLN: INTRAVENOUS | Status: DC

## 2023-08-28 MED ORDER — TRANEXAMIC ACID-NACL 1000-0.7 MG/100ML-% IV SOLN
INTRAVENOUS | Status: DC | PRN
  Administered 2023-08-28: 1000 mg via INTRAVENOUS

## 2023-08-28 MED ORDER — METRONIDAZOLE 500 MG/100ML IV SOLN
INTRAVENOUS | Status: AC
  Filled 2023-08-28: qty 100

## 2023-08-28 MED ORDER — POVIDONE-IODINE 10 % EX SWAB
2.0000 | Freq: Once | CUTANEOUS | Status: AC
  Administered 2023-08-28: 2 via TOPICAL

## 2023-08-28 MED ORDER — DOXYCYCLINE HYCLATE 100 MG PO TABS
ORAL_TABLET | ORAL | Status: AC
  Filled 2023-08-28: qty 1

## 2023-08-28 MED ORDER — PROPOFOL 10 MG/ML IV BOLUS
INTRAVENOUS | Status: DC | PRN
  Administered 2023-08-28 (×3): 50 mg via INTRAVENOUS
  Administered 2023-08-28: 150 mg via INTRAVENOUS

## 2023-08-28 MED ORDER — PROPOFOL 1000 MG/100ML IV EMUL
INTRAVENOUS | Status: AC
  Filled 2023-08-28: qty 200

## 2023-08-28 MED ORDER — AMISULPRIDE (ANTIEMETIC) 5 MG/2ML IV SOLN: 10.0000 mg | Freq: Once | INTRAVENOUS | Status: DC | PRN

## 2023-08-28 MED ORDER — HYDROMORPHONE HCL 1 MG/ML IJ SOLN
INTRAMUSCULAR | Status: AC
Start: 2023-08-28 — End: ?
  Filled 2023-08-28: qty 0.5

## 2023-08-28 MED ORDER — METRONIDAZOLE 500 MG/100ML IV SOLN
INTRAVENOUS | Status: DC | PRN
  Administered 2023-08-28: 500 mg via INTRAVENOUS

## 2023-08-28 MED ORDER — SODIUM CHLORIDE 0.9 % IV SOLN: INTRAVENOUS | Status: DC | PRN

## 2023-08-28 MED ORDER — ORAL CARE MOUTH RINSE: 15.0000 mL | Freq: Once | OROMUCOSAL | Status: AC

## 2023-08-28 MED ORDER — MIDAZOLAM HCL 2 MG/2ML IJ SOLN
INTRAMUSCULAR | Status: DC | PRN
  Administered 2023-08-28: 2 mg via INTRAVENOUS

## 2023-08-28 MED ORDER — FENTANYL CITRATE (PF) 250 MCG/5ML IJ SOLN
INTRAMUSCULAR | Status: AC
  Filled 2023-08-28: qty 5

## 2023-08-28 MED ORDER — ONDANSETRON HCL 4 MG/2ML IJ SOLN
INTRAMUSCULAR | Status: DC | PRN
  Administered 2023-08-28: 4 mg via INTRAVENOUS

## 2023-08-28 MED ORDER — MIDAZOLAM HCL 2 MG/2ML IJ SOLN
INTRAMUSCULAR | Status: AC
Start: 2023-08-28 — End: ?
  Filled 2023-08-28: qty 2

## 2023-08-28 MED ORDER — ACETAMINOPHEN 500 MG PO TABS
1000.0000 mg | ORAL_TABLET | Freq: Once | ORAL | Status: AC
  Administered 2023-08-28: 1000 mg via ORAL
  Filled 2023-08-28: qty 2

## 2023-08-28 MED ORDER — LIDOCAINE HCL 1 % IJ SOLN
INTRAMUSCULAR | Status: DC | PRN
  Administered 2023-08-28: 10 mL

## 2023-08-28 SURGICAL SUPPLY — 17 items
CATH ROBINSON RED A/P 16FR (CATHETERS) ×3 IMPLANT
DEVICE MYOSURE LITE (MISCELLANEOUS) IMPLANT
DEVICE MYOSURE REACH (MISCELLANEOUS) IMPLANT
GAUZE 4X4 16PLY ~~LOC~~+RFID DBL (SPONGE) ×1 IMPLANT
GLOVE BIO SURGEON STRL SZ7.5 (GLOVE) ×3 IMPLANT
GLOVE BIOGEL PI IND STRL 7.5 (GLOVE) ×3 IMPLANT
GLOVE SURG UNDER POLY LF SZ7 (GLOVE) ×3 IMPLANT
GOWN STRL REUS W/ TWL LRG LVL3 (GOWN DISPOSABLE) ×6 IMPLANT
KIT PROCEDURE FLUENT (KITS) ×3 IMPLANT
KIT TURNOVER KIT B (KITS) ×3 IMPLANT
PACK VAGINAL MINOR WOMEN LF (CUSTOM PROCEDURE TRAY) ×3 IMPLANT
PAD OB MATERNITY 11 LF (PERSONAL CARE ITEMS) ×3 IMPLANT
SEAL ROD LENS SCOPE MYOSURE (ABLATOR) ×3 IMPLANT
SET GENESYS HTA PROCERVA (MISCELLANEOUS) ×1 IMPLANT
SUT VIC AB 2-0 UR6 27 (SUTURE) ×2 IMPLANT
TOWEL GREEN STERILE FF (TOWEL DISPOSABLE) ×3 IMPLANT
UNDERPAD 30X36 HEAVY ABSORB (UNDERPADS AND DIAPERS) ×3 IMPLANT

## 2023-08-28 NOTE — H&P (Signed)
 Kelly Bender is an 43 y.o. female. Pt known to me with menorrhagia d/t symptomatic fibroids s/p failed novasure presenting today for hysteroscopy D&C possible resection of fibroid and HTA.  Pertinent Gynecological History:  Previous GYN Procedures: as above  Last mammogram: birad 2 Date: 11/2022 Last pap: normal Date: 10/2022 OB History: G1, P1001   Menstrual History:  Patient's last menstrual period was 08/11/2023 (approximate).    Past Medical History:  Diagnosis Date   Submucous uterine fibroid     Past Surgical History:  Procedure Laterality Date   CESAREAN SECTION  12/27/2008   DILATATION & CURRETTAGE/HYSTEROSCOPY WITH RESECTOCOPE N/A 05/10/2015   Procedure: DILATATION & CURETTAGE/Hystroscopic Resection Submucosal Fibroids;  Surgeon: Lula Sale, MD;  Location: [facility];  Service: Gynecology;  Laterality: N/A;   DILATATION & CURRETTAGE/HYSTEROSCOPY WITH RESECTOCOPE N/A 08/29/2017   Procedure: DILATATION & CURETTAGE/HYSTEROSCOPY WITH RESECTOCOPE;  Surgeon: Lula Sale, MD;  Location: [facility];  Service: Gynecology;  Laterality: N/A;   TONSILLECTOMY  07-26-2016  dr Valiant Gaul  [facility]    Family History  Problem Relation Age of Onset   Thyroid  disease Neg Hx     Social History:  reports that she quit smoking about 6 years ago. Her smoking use included cigarettes. She started smoking about 13 years ago. She has a 3.5 pack-year smoking history. She has never used smokeless tobacco. She reports current alcohol use. She reports that she does not use drugs.  Allergies: No Known Allergies  No medications prior to admission.    Review of Systems Denies F/C/N/V/D  Last menstrual period 08/11/2023. Physical Exam Lungs unlabored breathing CV RRR Abdomen soft, NT Extremities no calf tenderness  No results found for this or any previous visit (from the past 24 hours).  No results found.  Assessment/Plan: 43 yo G1P1001 with menorrhagia d/t symptomatic  fibroids scheduled for hysteroscopy D&C possible myosure and HTA.  Does not desire future fertility and plans to use condoms.  Risks benefits and alternatives reviewed including but not limited to bleeding infection and injury and she understands pregnancy is contraindicated following an ablation.  Questions answered and consent signed and witnessed.  [name] 08/28/2023, 6:34 AM

## 2023-08-28 NOTE — Anesthesia Procedure Notes (Signed)
 Procedure Name: LMA Insertion Date/Time: 08/28/2023 10:36 AM  Performed by: Chanda Combes, CRNAPre-anesthesia Checklist: Patient identified, Emergency Drugs available, Suction available and Patient being monitored Patient Re-evaluated:Patient Re-evaluated prior to induction Oxygen Delivery Method: Circle System Utilized Preoxygenation: Pre-oxygenation with 100% oxygen Induction Type: IV induction Ventilation: Mask ventilation without difficulty LMA: LMA inserted LMA Size: 4.0 Number of attempts: 1 Airway Equipment and Method: Bite block Placement Confirmation: positive ETCO2 Tube secured with: Tape Dental Injury: Teeth and Oropharynx as per pre-operative assessment

## 2023-08-28 NOTE — Anesthesia Postprocedure Evaluation (Signed)
 Anesthesia Post Note  Patient: Kelly Bender  Procedure(s) Performed: DILATATION & CURETTAGE/HYSTEROSCOPY WITH RESECTOCOPE (Vagina ) ABLATION, ENDOMETRIUM, HYSTEROSCOPIC HYDROTHERMAL (Uterus)     Patient location during evaluation: PACU Anesthesia Type: General Level of consciousness: awake Pain management: pain level controlled Vital Signs Assessment: post-procedure vital signs reviewed and stable Respiratory status: spontaneous breathing, nonlabored ventilation and respiratory function stable Cardiovascular status: blood pressure returned to baseline and stable Postop Assessment: no apparent nausea or vomiting Anesthetic complications: no   No notable events documented.  Last Vitals:  Vitals:   08/28/23 1315 08/28/23 1330  BP: 111/66 119/71  Pulse: 73 81  Resp: 17 12  Temp:    SpO2: 100% 99%    Last Pain:  Vitals:   08/28/23 1315  TempSrc:   PainSc: 0-No pain                 Conard Decent

## 2023-08-28 NOTE — Transfer of Care (Signed)
 Immediate Anesthesia Transfer of Care Note  Patient: Kelly Bender  Procedure(s) Performed: DILATATION & CURETTAGE/HYSTEROSCOPY WITH RESECTOCOPE (Vagina ) ABLATION, ENDOMETRIUM, HYSTEROSCOPIC HYDROTHERMAL (Uterus)  Patient Location: PACU  Anesthesia Type:General  Level of Consciousness: drowsy  Airway & Oxygen Therapy: Patient Spontanous Breathing and Patient connected to face mask oxygen  Post-op Assessment: Report given to RN, Post -op Vital signs reviewed and stable, and Patient moving all extremities  Post vital signs: Reviewed and stable  Last Vitals:  Vitals Value Taken Time  BP 124/76 08/28/23 1300  Temp    Pulse 80 08/28/23 1302  Resp 17 08/28/23 1302  SpO2 100 % 08/28/23 1302  Vitals shown include unfiled device data.  Last Pain:  Vitals:   08/28/23 0819  TempSrc:   PainSc: 0-No pain         Complications: No notable events documented.

## 2023-08-28 NOTE — Op Note (Addendum)
 Preop Diagnosis: 1.Uterine leiomyoma 2.Menorrhagia  Postop Diagnosis: 1.Uterine leiomyoma 2.Menorrhagia  Procedure: 1.HYSTEROSCOPY/DILATION AND CURETTAGE [58558] 2.HYDROTHERMAL ENDOMETRIAL ABLATION [45409]   Anesthesia: General   Anesthesiologist: Conard Decent, MD   Attending: Renea Carrion, MD   Assistant: Surgical Tech  Findings: LUS/Internal Os submucosal fibroid  Pathology: Endometrial Curettings  Fluids: See flowsheet  UOP: Voided prior to procedure and straight cath about 50cc at the end of procedure  EBL: 500 cc  Complications: None  Procedure:  The patient was taken to the operating room after the risks, benefits and alternatives discussed with the patient. The patient verbalized understanding and consent signed and witnessed. The patient was given a spinal per anesthesia and prepped and draped in the normal sterile fashion and Time Out performed per protocol. A bivalve speculum was placed in the patient's vagina and the anterior lip of the cervix was grasped with a single tooth tenaculum. A paracervical block was administered using a total of 10 cc of 1% lidocaine . The uterus sounded to 14 cm. The cervix was dilated for passage of the hysteroscope around the submucosal fibroid but not too much to avoid failure of cavity assessment for HTA. The hysteroscope was introduced into the uterine cavity and findings as noted above. Sharp curettage was performed until a gritty texture was noted and currettings sent to pathology. The HTA hysteroscope was reintroduced and ablation performed without difficulty but required tenaculums on the anterior and posterior lips of the cervix. Good ablation results were noted. The cervix tore through several times d/t to the high resistance at the internal os which ultimately needed dilation to 35FR.  The anterior and posterior lips of the cervix was repaired with 2-0 vicryl with good hemostasis.  Txa had been given as well.  All  instruments were removed. Sponge lap and needle count was correct. The patient tolerated the procedure well and was returned to the recovery room in good condition.

## 2023-08-29 ENCOUNTER — Encounter (HOSPITAL_COMMUNITY): Payer: Self-pay | Admitting: Obstetrics and Gynecology

## 2023-08-29 LAB — SURGICAL PATHOLOGY
